# Patient Record
Sex: Male | Born: 1975
Health system: Southern US, Community
[De-identification: ages and names within clinical notes are randomized; demographics above are authoritative.]

## PROBLEM LIST (undated history)

## (undated) DIAGNOSIS — I10 Essential (primary) hypertension: Secondary | ICD-10-CM

## (undated) DIAGNOSIS — E119 Type 2 diabetes mellitus without complications: Secondary | ICD-10-CM

---

## 1998-12-18 ENCOUNTER — Emergency Department (HOSPITAL_COMMUNITY): Admission: EM | Admit: 1998-12-18 | Discharge: 1998-12-18 | Payer: Self-pay | Admitting: Emergency Medicine

## 1999-05-01 ENCOUNTER — Encounter: Payer: Self-pay | Admitting: Emergency Medicine

## 1999-05-01 ENCOUNTER — Emergency Department (HOSPITAL_COMMUNITY): Admission: EM | Admit: 1999-05-01 | Discharge: 1999-05-01 | Payer: Self-pay | Admitting: Emergency Medicine

## 2007-01-17 ENCOUNTER — Encounter: Admission: RE | Admit: 2007-01-17 | Discharge: 2007-01-17 | Payer: Self-pay | Admitting: Emergency Medicine

## 2008-07-08 ENCOUNTER — Emergency Department (HOSPITAL_COMMUNITY): Admission: EM | Admit: 2008-07-08 | Discharge: 2008-07-08 | Payer: Self-pay | Admitting: Emergency Medicine

## 2010-12-21 ENCOUNTER — Other Ambulatory Visit: Payer: Self-pay

## 2010-12-21 ENCOUNTER — Emergency Department (HOSPITAL_COMMUNITY)
Admission: EM | Admit: 2010-12-21 | Discharge: 2010-12-21 | Disposition: A | Discharge status: Home / Self Care | Payer: Self-pay | Attending: Emergency Medicine | Admitting: Emergency Medicine

## 2010-12-21 DIAGNOSIS — R42 Dizziness and giddiness: Secondary | ICD-10-CM | POA: Insufficient documentation

## 2010-12-21 DIAGNOSIS — I10 Essential (primary) hypertension: Secondary | ICD-10-CM | POA: Insufficient documentation

## 2010-12-21 DIAGNOSIS — E669 Obesity, unspecified: Secondary | ICD-10-CM | POA: Insufficient documentation

## 2010-12-21 LAB — URINALYSIS, ROUTINE W REFLEX MICROSCOPIC
Bilirubin Urine: NEGATIVE
Glucose, UA: NEGATIVE mg/dL
Hgb urine dipstick: NEGATIVE
Ketones, ur: NEGATIVE mg/dL
Nitrite: NEGATIVE
Protein, ur: NEGATIVE mg/dL
Specific Gravity, Urine: 1.02 (ref 1.005–1.030)
Urobilinogen, UA: 1 mg/dL (ref 0.0–1.0)
pH: 8 (ref 5.0–8.0)

## 2010-12-21 LAB — URINE MICROSCOPIC-ADD ON

## 2010-12-21 LAB — POCT I-STAT, CHEM 8
BUN: 11 mg/dL (ref 6–23)
Calcium, Ion: 1.2 mmol/L (ref 1.12–1.32)
Chloride: 104 mEq/L (ref 96–112)
Creatinine, Ser: 1 mg/dL (ref 0.50–1.35)
Glucose, Bld: 94 mg/dL (ref 70–99)
HCT: 45 % (ref 39.0–52.0)
Hemoglobin: 15.3 g/dL (ref 13.0–17.0)
Potassium: 5 mEq/L (ref 3.5–5.1)
Sodium: 141 mEq/L (ref 135–145)
TCO2: 27 mmol/L (ref 0–100)

## 2010-12-21 NOTE — ED Provider Notes (Signed)
History     35yM with multiple complaints. Feels  Intermittently  "dizzy." Described sensation of feeling lightheaded and not true vertigo. Has been feeling like this for a little over a week. Feels like in a fog and run down. Concerned that elevated BP may be contrinuting to symptoms. Has been told more than once in past that blood pressure is "borderline high." Has been getting mild diffuse HAs. Denies trauma. NO fever or chills. NO n/v. NO neck pain or stiffness. No rash. Has noticed for past year anterior and lateral L thigh will sometimes go numb when is on feet for long periods of time. Otherwise no numbness, weakness or loss of strength.  CSN: [id number] Arrival date & time: 12/21/2010  2:26 PM   First MD Initiated Contact with Patient 12/21/10 1502      Chief Complaint  Patient presents with  . Dizziness    pt in with headaches weakness  and stated elevated BP for several days denies n/v/d also c/o numbness to the left thigh for several months    (Consider location/radiation/quality/duration/timing/severity/associated sxs/prior treatment) HPI  History reviewed. No pertinent past medical history.  History reviewed. No pertinent past surgical history.  History reviewed. No pertinent family history.  History  Substance Use Topics  . Smoking status: Never Smoker   . Smokeless tobacco: Not on file  . Alcohol Use: No      Review of Systems   Review of symptoms negative unless otherwise noted in HPI.  Allergies  Review of patient's allergies indicates no known allergies.  Home Medications   Current Outpatient Rx  Name Route Sig Dispense Refill  . ACETAMINOPHEN 500 MG PO TABS Oral Take 1,000 mg by mouth every 6 (six) hours as needed. For pain.       BP 182/98  Pulse 83  Temp(Src) 98.4 F (36.9 C) (Oral)  Resp 18  SpO2 99%  Physical Exam  Nursing note and vitals reviewed. Constitutional: He is oriented to person, place, and time. No distress.       Very obese   HENT:  Head: Normocephalic and atraumatic.  Right Ear: External ear normal.  Left Ear: External ear normal.  Nose: Nose normal.  Mouth/Throat: Oropharynx is clear and moist.  Eyes: Conjunctivae and EOM are normal. Pupils are equal, round, and reactive to light. Right eye exhibits no discharge. Left eye exhibits no discharge. No scleral icterus.  Neck: Normal range of motion. Neck supple.  Cardiovascular: Normal rate, regular rhythm and normal heart sounds.  Exam reveals no gallop and no friction rub.   No murmur heard. Pulmonary/Chest: Effort normal and breath sounds normal. No respiratory distress.  Abdominal: Soft. He exhibits no distension. There is no tenderness.  Musculoskeletal: He exhibits no edema and no tenderness.  Lymphadenopathy:    He has no cervical adenopathy.  Neurological: He is alert and oriented to person, place, and time. No cranial nerve deficit. He exhibits normal muscle tone. Coordination normal.  Skin: Skin is warm and dry. He is not diaphoretic.  Psychiatric: He has a normal mood and affect. His behavior is normal. Thought content normal.    ED Course  Procedures (including critical care time)  Labs Reviewed  URINALYSIS, ROUTINE W REFLEX MICROSCOPIC - Abnormal; Notable for the following:    Leukocytes, UA SMALL (*)    All other components within normal limits  URINE MICROSCOPIC-ADD ON  POCT I-STAT, CHEM 8  I-STAT, CHEM 8   No results found.  EKG:  Rhythm: normal sinus  Rate: 71 Axis: normal Intervals: QRS 102 ms ST segments: flipped t in III. No previous for comparison   1. Hypertension   2. Obesity   3. Dizziness - light-headed       MDM  35yM with dizziness and HTN. EKG nondiagnostic. No evidence of end organ damage at this time. Pt clinically very well appearing. Given normal BP once calmed down and settled into bed will refrain from starting on BO med at this time.        Raeford Razor, MD 12/21/10 [phone]

## 2012-01-02 ENCOUNTER — Encounter (HOSPITAL_COMMUNITY): Payer: Self-pay | Admitting: *Deleted

## 2012-01-02 ENCOUNTER — Emergency Department (HOSPITAL_COMMUNITY)
Admission: EM | Admit: 2012-01-02 | Discharge: 2012-01-02 | Disposition: A | Discharge status: Home / Self Care | Payer: No Typology Code available for payment source | Attending: Emergency Medicine | Admitting: Emergency Medicine

## 2012-01-02 DIAGNOSIS — S139XXA Sprain of joints and ligaments of unspecified parts of neck, initial encounter: Secondary | ICD-10-CM | POA: Insufficient documentation

## 2012-01-02 DIAGNOSIS — M549 Dorsalgia, unspecified: Secondary | ICD-10-CM | POA: Insufficient documentation

## 2012-01-02 DIAGNOSIS — Y92410 Unspecified street and highway as the place of occurrence of the external cause: Secondary | ICD-10-CM | POA: Insufficient documentation

## 2012-01-02 DIAGNOSIS — S161XXA Strain of muscle, fascia and tendon at neck level, initial encounter: Secondary | ICD-10-CM

## 2012-01-02 DIAGNOSIS — Y9389 Activity, other specified: Secondary | ICD-10-CM | POA: Insufficient documentation

## 2012-01-02 DIAGNOSIS — I10 Essential (primary) hypertension: Secondary | ICD-10-CM | POA: Insufficient documentation

## 2012-01-02 HISTORY — DX: Essential (primary) hypertension: I10

## 2012-01-02 MED ORDER — CYCLOBENZAPRINE HCL 10 MG PO TABS: 10.0000 mg | ORAL_TABLET | Freq: Two times a day (BID) | ORAL | Status: DC | PRN

## 2012-01-02 MED ORDER — HYDROCODONE-ACETAMINOPHEN 5-325 MG PO TABS: 2.0000 | ORAL_TABLET | Freq: Four times a day (QID) | ORAL | Status: DC | PRN
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
## 2012-01-05 NOTE — ED Provider Notes (Signed)
Medical screening examination/treatment/procedure(s) were performed by non-physician practitioner and as supervising physician I was immediately available for consultation/collaboration.  Tifani Dack T Delrae Hagey, MD 01/05/12 1642 

## 2012-05-21 ENCOUNTER — Encounter (HOSPITAL_COMMUNITY): Payer: Self-pay | Admitting: Physical Medicine and Rehabilitation

## 2012-05-21 ENCOUNTER — Emergency Department (HOSPITAL_COMMUNITY)
Admission: EM | Admit: 2012-05-21 | Discharge: 2012-05-21 | Disposition: A | Discharge status: Home / Self Care | Payer: Self-pay | Attending: Emergency Medicine | Admitting: Emergency Medicine

## 2012-05-21 DIAGNOSIS — Z2089 Contact with and (suspected) exposure to other communicable diseases: Secondary | ICD-10-CM | POA: Insufficient documentation

## 2012-05-21 DIAGNOSIS — Z202 Contact with and (suspected) exposure to infections with a predominantly sexual mode of transmission: Secondary | ICD-10-CM

## 2012-05-21 DIAGNOSIS — I10 Essential (primary) hypertension: Secondary | ICD-10-CM | POA: Insufficient documentation

## 2012-05-21 MED ORDER — METRONIDAZOLE 500 MG PO TABS
2000.0000 mg | ORAL_TABLET | Freq: Once | ORAL | Status: AC
  Administered 2012-05-21: 2000 mg via ORAL
  Filled 2012-05-21: qty 4

## 2012-05-21 NOTE — ED Notes (Signed)
Pt presents to department to be tested for possible STD. States wife tested positive for trichomonas yesterday and pt is concerned he could of been exposed. Denies symptoms at the time. Pt is alert and oriented x4. No signs of distress noted.

## 2012-05-21 NOTE — ED Notes (Signed)
Pt requesting STD check. Denies symptoms at the time.

## 2012-05-21 NOTE — ED Provider Notes (Signed)
History     CSN: [id number]  Arrival date & time 05/21/12  4098   First MD Initiated Contact with Patient 05/21/12 [phone]      Chief Complaint  Patient presents with  . Exposure to STD    (Consider location/radiation/quality/duration/timing/severity/associated sxs/prior treatment) HPI Patient presents to the emergency department with concerns related to his wife having trichomonas in her urine.  Patient was advised to come here for treatment.  Patient denies penile discharge, dysuria, fever, testicular pain or swelling.  Patient, states, that he has never had an STD. Past Medical History  Diagnosis Date  . Hypertension     No past surgical history on file.  History reviewed. No pertinent family history.  History  Substance Use Topics  . Smoking status: Never Smoker   . Smokeless tobacco: Never Used  . Alcohol Use: No      Review of Systems All other systems negative except as documented in the HPI. All pertinent positives and negatives as reviewed in the HPI. Allergies  Review of patient's allergies indicates no known allergies.  Home Medications  No current outpatient prescriptions on file.  BP 154/86  Pulse 86  Temp(Src) 98.1 F (36.7 C) (Oral)  Resp 18  SpO2 95%  Physical Exam  Nursing note and vitals reviewed. Constitutional: He appears well-developed and well-nourished. No distress.  HENT:  Head: Normocephalic and atraumatic.  Eyes: Pupils are equal, round, and reactive to light.  Genitourinary: Testes normal and penis normal. No penile tenderness.  Skin: Skin is warm and dry. No rash noted.    ED Course  Procedures (including critical care time)  The patient refused uretheral swab. Told to return here as needed.    MDM          Carlyle Dolly, PA-C 05/21/12 [phone]

## 2012-05-21 NOTE — ED Provider Notes (Signed)
Medical screening examination/treatment/procedure(s) were performed by non-physician practitioner and as supervising physician I was immediately available for consultation/collaboration.  Remy Voiles R. Elgin Carn, MD 05/21/12 1541 

## 2012-07-05 ENCOUNTER — Encounter (HOSPITAL_COMMUNITY): Payer: Self-pay | Admitting: Emergency Medicine

## 2012-07-05 ENCOUNTER — Emergency Department (HOSPITAL_COMMUNITY)
Admission: EM | Admit: 2012-07-05 | Discharge: 2012-07-05 | Disposition: A | Discharge status: Home / Self Care | Payer: Self-pay | Attending: Emergency Medicine | Admitting: Emergency Medicine

## 2012-07-05 DIAGNOSIS — Y929 Unspecified place or not applicable: Secondary | ICD-10-CM | POA: Insufficient documentation

## 2012-07-05 DIAGNOSIS — I10 Essential (primary) hypertension: Secondary | ICD-10-CM | POA: Insufficient documentation

## 2012-07-05 DIAGNOSIS — W268XXA Contact with other sharp object(s), not elsewhere classified, initial encounter: Secondary | ICD-10-CM | POA: Insufficient documentation

## 2012-07-05 DIAGNOSIS — S61409A Unspecified open wound of unspecified hand, initial encounter: Secondary | ICD-10-CM | POA: Insufficient documentation

## 2012-07-05 DIAGNOSIS — Y9389 Activity, other specified: Secondary | ICD-10-CM | POA: Insufficient documentation

## 2012-07-05 DIAGNOSIS — Z23 Encounter for immunization: Secondary | ICD-10-CM | POA: Insufficient documentation

## 2012-07-05 DIAGNOSIS — S61412A Laceration without foreign body of left hand, initial encounter: Secondary | ICD-10-CM

## 2012-07-05 MED ORDER — TETANUS-DIPHTH-ACELL PERTUSSIS 5-2.5-18.5 LF-MCG/0.5 IM SUSP
0.5000 mL | Freq: Once | INTRAMUSCULAR | Status: AC
  Administered 2012-07-05: 0.5 mL via INTRAMUSCULAR
  Filled 2012-07-05: qty 0.5

## 2012-07-05 NOTE — ED Notes (Signed)
Pt here with laceration to left hand; bleeding controlled

## 2012-07-05 NOTE — ED Provider Notes (Signed)
History    This chart was scribed for Junius Finner (PA) non-physician practitioner working with Gwyneth Sprout, MD by Sofie Rower, ED Scribe. This patient was seen in room TR07C/TR07C and the patient's care was started at 7:25PM.   CSN: [id number]  Arrival date & time 07/05/12  1706   First MD Initiated Contact with Patient 07/05/12 1925      Chief Complaint  Patient presents with  . Extremity Laceration    (Consider location/radiation/quality/duration/timing/severity/associated sxs/prior treatment) The history is provided by the patient. No language interpreter was used.    Anthony Collier is a 37 y.o. male , with a hx of hypertension, who presents to the Emergency Department complaining of sudden, moderate, extremity laceration, located at the dorsal aspect of the left hand, onset today (07/05/12 at 2:00PM). The pt reports he was taking out the trash earlier this evening, where he suddenly cut his left hand upon a piece of metal. The pt has applied a bandage dressing upon the laceration, which controls all the bleeding associated with the laceration at this time. The pt is unaware of the date of his last tetanus immunization.   The pt does not smoke or drink alcohol.   Pt does not have a PCP.    Past Medical History  Diagnosis Date  . Hypertension     History reviewed. No pertinent past surgical history.  History reviewed. No pertinent family history.  History  Substance Use Topics  . Smoking status: Never Smoker   . Smokeless tobacco: Never Used  . Alcohol Use: No      Review of Systems  Skin: Positive for wound.  All other systems reviewed and are negative.    Allergies  Review of patient's allergies indicates no known allergies.  Home Medications  No current outpatient prescriptions on file.  BP 182/83  Pulse 88  Temp(Src) 98.8 F (37.1 C) (Oral)  Resp 18  SpO2 98%  Physical Exam  Nursing note and vitals reviewed. Constitutional: He is oriented  to person, place, and time. He appears well-developed and well-nourished. No distress.  HENT:  Head: Normocephalic and atraumatic.  Eyes: EOM are normal.  Neck: Neck supple. No tracheal deviation present.  Cardiovascular: Normal rate, regular rhythm and normal heart sounds.  Exam reveals no gallop and no friction rub.   No murmur heard. Pulmonary/Chest: Effort normal and breath sounds normal. No respiratory distress. He has no wheezes.  Musculoskeletal: Normal range of motion.  Neurological: He is alert and oriented to person, place, and time.  Skin: Skin is warm and dry. Laceration noted.     4 cm superficial laceration located at the dorsal aspect of the left hand. Hemostasis achieved. No erythema, no warmth, nor drainage. No foreign bodies.   Psychiatric: He has a normal mood and affect. His behavior is normal.    ED Course  Procedures (including critical care time)  DIAGNOSTIC STUDIES: Oxygen Saturation is 98% on room air, normal by my interpretation.    COORDINATION OF CARE:  7:29 PM- Treatment plan discussed with patient. Pt agrees with treatment.      Labs Reviewed - No data to display No results found.   1. Superficial laceration of hand, left, initial encounter       MDM  Pt has superficial laceration on left hand.  Wound was cleaned under tap water then dressed with guaze and antibacterial ointment.  Pt was given tetanus shot in ED.  Discussed with pt to seek medical attention if notices any  signs of infection.  F/u with PCP, [place] [facility] info provided. Return precautions given. Pt verbalized understanding and agreement with tx plan. Vitals: unremarkable. Discharged in stable condition.     I personally performed the services described in this documentation, which was scribed in my presence. The recorded information has been reviewed and is accurate.    Junius Finner, PA-C 07/06/12 1219

## 2012-07-06 NOTE — ED Provider Notes (Signed)
Medical screening examination/treatment/procedure(s) were performed by non-physician practitioner and as supervising physician I was immediately available for consultation/collaboration.   Gwyneth Sprout, MD 07/06/12 2318

## 2013-06-13 ENCOUNTER — Emergency Department (HOSPITAL_COMMUNITY): Payer: BC Managed Care – PPO

## 2013-06-13 ENCOUNTER — Emergency Department (HOSPITAL_COMMUNITY)
Admission: EM | Admit: 2013-06-13 | Discharge: 2013-06-13 | Disposition: A | Discharge status: Home / Self Care | Payer: BC Managed Care – PPO | Attending: Emergency Medicine | Admitting: Emergency Medicine

## 2013-06-13 ENCOUNTER — Encounter (HOSPITAL_COMMUNITY): Payer: Self-pay | Admitting: Emergency Medicine

## 2013-06-13 DIAGNOSIS — M25511 Pain in right shoulder: Secondary | ICD-10-CM

## 2013-06-13 DIAGNOSIS — M722 Plantar fascial fibromatosis: Secondary | ICD-10-CM

## 2013-06-13 DIAGNOSIS — Y9389 Activity, other specified: Secondary | ICD-10-CM | POA: Insufficient documentation

## 2013-06-13 DIAGNOSIS — S46909A Unspecified injury of unspecified muscle, fascia and tendon at shoulder and upper arm level, unspecified arm, initial encounter: Secondary | ICD-10-CM | POA: Insufficient documentation

## 2013-06-13 DIAGNOSIS — S199XXA Unspecified injury of neck, initial encounter: Secondary | ICD-10-CM

## 2013-06-13 DIAGNOSIS — X503XXA Overexertion from repetitive movements, initial encounter: Secondary | ICD-10-CM | POA: Insufficient documentation

## 2013-06-13 DIAGNOSIS — S0993XA Unspecified injury of face, initial encounter: Secondary | ICD-10-CM | POA: Insufficient documentation

## 2013-06-13 DIAGNOSIS — I10 Essential (primary) hypertension: Secondary | ICD-10-CM | POA: Insufficient documentation

## 2013-06-13 DIAGNOSIS — Y9289 Other specified places as the place of occurrence of the external cause: Secondary | ICD-10-CM | POA: Insufficient documentation

## 2013-06-13 DIAGNOSIS — Y990 Civilian activity done for income or pay: Secondary | ICD-10-CM | POA: Insufficient documentation

## 2013-06-13 DIAGNOSIS — S4980XA Other specified injuries of shoulder and upper arm, unspecified arm, initial encounter: Secondary | ICD-10-CM | POA: Insufficient documentation

## 2013-06-13 MED ORDER — IBUPROFEN 800 MG PO TABS: 800.0000 mg | ORAL_TABLET | Freq: Three times a day (TID) | ORAL | Status: DC

## 2013-06-13 NOTE — Discharge Instructions (Signed)
Use a frozen water bottle to stretch or your foot as discussed. Followup with orthopedics. Take ibuprofen as directed. Apply ice to your shoulder and minimize your lifting with that arm.  Shoulder Pain The shoulder is the joint that connects your arms to your body. The bones that form the shoulder joint include the upper arm bone (humerus), the shoulder blade (scapula), and the collarbone (clavicle). The top of the humerus is shaped like a ball and fits into a rather flat socket on the scapula (glenoid cavity). A combination of muscles and strong, fibrous tissues that connect muscles to bones (tendons) support your shoulder joint and hold the ball in the socket. Small, fluid-filled sacs (bursae) are located in different areas of the joint. They act as cushions between the bones and the overlying soft tissues and help reduce friction between the gliding tendons and the bone as you move your arm. Your shoulder joint allows a wide range of motion in your arm. This range of motion allows you to do things like scratch your back or throw a ball. However, this range of motion also makes your shoulder more prone to pain from overuse and injury. Causes of shoulder pain can originate from both injury and overuse and usually can be grouped in the following four categories:  Redness, swelling, and pain (inflammation) of the tendon (tendinitis) or the bursae (bursitis).  Instability, such as a dislocation of the joint.  Inflammation of the joint (arthritis).  Broken bone (fracture). HOME CARE INSTRUCTIONS   Apply ice to the sore area.  Put ice in a plastic bag.  Place a towel between your skin and the bag.  Leave the ice on for 15-20 minutes, 03-04 times per day for the first 2 days.  Stop using cold packs if they do not help with the pain.  If you have a shoulder sling or immobilizer, wear it as long as your caregiver instructs. Only remove it to shower or bathe. Move your arm as little as possible, but  keep your hand moving to prevent swelling.  Squeeze a soft ball or foam pad as much as possible to help prevent swelling.  Only take over-the-counter or prescription medicines for pain, discomfort, or fever as directed by your caregiver. SEEK MEDICAL CARE IF:   Your shoulder pain increases, or new pain develops in your arm, hand, or fingers.  Your hand or fingers become cold and numb.  Your pain is not relieved with medicines. SEEK IMMEDIATE MEDICAL CARE IF:   Your arm, hand, or fingers are numb or tingling.  Your arm, hand, or fingers are significantly swollen or turn white or blue. MAKE SURE YOU:   Understand these instructions.  Will watch your condition.  Will get help right away if you are not doing well or get worse. Document Released: 10/22/2004 Document Revised: 10/07/2011 Document Reviewed: 12/27/2010 [facility] Patient Information 2014 [place], [place].  Plantar Fasciitis Plantar fasciitis is a common condition that causes foot pain. It is soreness (inflammation) of the band of tough fibrous tissue on the bottom of the foot that runs from the heel bone (calcaneus) to the ball of the foot. The cause of this soreness may be from excessive standing, poor fitting shoes, running on hard surfaces, being overweight, having an abnormal walk, or overuse (this is common in runners) of the painful foot or feet. It is also common in aerobic exercise dancers and ballet dancers. SYMPTOMS  Most people with plantar fasciitis complain of:  Severe pain in the morning  on the bottom of their foot especially when taking the first steps out of bed. This pain recedes after a few minutes of walking.  Severe pain is experienced also during walking following a long period of inactivity.  Pain is worse when walking barefoot or up stairs DIAGNOSIS   Your caregiver will diagnose this condition by examining and feeling your foot.  Special tests such as X-rays of your foot, are usually not  needed. PREVENTION   Consult a sports medicine professional before beginning a new exercise program.  Walking programs offer a good workout. With walking there is a lower chance of overuse injuries common to runners. There is less impact and less jarring of the joints.  Begin all new exercise programs slowly. If problems or pain develop, decrease the amount of time or distance until you are at a comfortable level.  Wear good shoes and replace them regularly.  Stretch your foot and the heel cords at the back of the ankle (Achilles tendon) both before and after exercise.  Run or exercise on even surfaces that are not hard. For example, asphalt is better than pavement.  Do not run barefoot on hard surfaces.  If using a treadmill, vary the incline.  Do not continue to workout if you have foot or joint problems. Seek professional help if they do not improve. HOME CARE INSTRUCTIONS   Avoid activities that cause you pain until you recover.  Use ice or cold packs on the problem or painful areas after working out.  Only take over-the-counter or prescription medicines for pain, discomfort, or fever as directed by your caregiver.  Soft shoe inserts or athletic shoes with air or gel sole cushions may be helpful.  If problems continue or become more severe, consult a sports medicine caregiver or your own health care provider. Cortisone is a potent anti-inflammatory medication that may be injected into the painful area. You can discuss this treatment with your caregiver. MAKE SURE YOU:   Understand these instructions.  Will watch your condition.  Will get help right away if you are not doing well or get worse. Document Released: 10/07/2000 Document Revised: 04/06/2011 Document Reviewed: 12/07/2007 [facility]ExitCare Patient Information 2014 [place]ExitCare, [place]LL[place].

## 2013-06-13 NOTE — ED Notes (Signed)
Pt to radiology.

## 2013-06-13 NOTE — ED Provider Notes (Signed)
CSN: [id number]     Arrival date & time 06/13/13  1141 History  This chart was scribed for Johnnette Gourdobyn Albert PA-C, working with Geoffery Lyonsouglas Delo, MD by Ashley JacobsBrittany Andrews, ED scribe. This patient was seen in room WTR6/WTR6 and the patient's care was started at 12:22 PM.   First MD Initiated Contact with Patient 06/13/13 1143     Chief Complaint  Patient presents with  . Shoulder Pain     (Consider location/radiation/quality/duration/timing/severity/associated sxs/prior Treatment) Patient is a 38 y.o. male presenting with shoulder pain. The history is provided by the patient and medical records. No language interpreter was used.  Shoulder Pain   HPI Comments: Anthony Collier is a 38 y.o. male who presents to the Emergency Department complaining of right shoulder pain, onset one week ago while pushing a heavy object upwards while at work. The pain intermittently radiates to his neck. The pain is described as a 5/10 in severity. Anthony Collier reports the pain is worse when Anthony Collier wakes in the morning and with movement. Denies numbness and tingling.  Denies prior injury.  Patient also complaining of left foot pain x6 months. Patient states when Anthony Collier wakes up in the morning Anthony Collier has a sharp pain when Anthony Collier takes a step down, goes away after Anthony Collier walks for a while, however if Anthony Collier is immobile and standing the pain returns. Anthony Collier has not tried any alleviating factors.  Past Medical History  Diagnosis Date  . Hypertension    History reviewed. No pertinent past surgical history. No family history on file. History  Substance Use Topics  . Smoking status: Never Smoker   . Smokeless tobacco: Never Used  . Alcohol Use: No    Review of Systems  Musculoskeletal: Positive for neck pain.       Right shoulder pain.    Neurological: Negative for numbness.      Allergies  Review of patient's allergies indicates no known allergies.  Home Medications   Prior to Admission medications   Not on File   BP 127/74  Pulse 77   Temp(Src) 97.9 F (36.6 C) (Oral)  Resp 20  Ht 6\' 2"  (1.88 m)  Wt 345 lb (156.491 kg)  BMI 44.28 kg/m2  SpO2 97% Physical Exam  Nursing note and vitals reviewed. Constitutional: Anthony Collier is oriented to person, place, and time. Anthony Collier appears well-developed and well-nourished. No distress.  HENT:  Head: Normocephalic and atraumatic.  Eyes: Conjunctivae and EOM are normal.  Neck: Normal range of motion. Neck supple.  Cardiovascular: Normal rate, regular rhythm and normal heart sounds.   Intact distal pulses.  Pulmonary/Chest: Effort normal and breath sounds normal.  Musculoskeletal: Normal range of motion. Anthony Collier exhibits tenderness. Anthony Collier exhibits no edema.  Anterior aspect of R shoulder Full ROM pain noted with flexion, extension and abduction C-spine neck is normal Tenderness at insertion of the bicep tendon. Tenderness to palpation of her plantar fascia of left foot. Full range of motion. Normal gait.  Neurological: Anthony Collier is alert and oriented to person, place, and time.  Skin: Skin is warm and dry.  Psychiatric: Anthony Collier has a normal mood and affect. His behavior is normal.    ED Course  Procedures (including critical care time) DIAGNOSTIC STUDIES: Oxygen Saturation is 97% on room air, normal by my interpretation.    COORDINATION OF CARE:  12:25 PM Discussed course of care with pt . Pt understands and agrees.   Labs Review Labs Reviewed - No data to display  Imaging Review Dg Shoulder Right  06/13/2013  CLINICAL DATA:  Right shoulder pain for 1 week  EXAM: RIGHT SHOULDER - 2+ VIEW  COMPARISON:  None.  FINDINGS: There is no evidence of fracture or dislocation. There is no evidence of arthropathy or other focal bone abnormality. Soft tissues are unremarkable.  IMPRESSION: Negative.   Electronically Signed   By: Elige KoHetal  Patel   On: 06/13/2013 13:15     EKG Interpretation None      MDM   Final diagnoses:  Right shoulder pain  Plantar fasciitis of left foot   Patient presenting with  shoulder pain after mechanical injury and foot pain. Well-appearing in no apparent distress. Afebrile, vital signs stable. Shoulder x-ray without any acute findings. Advised rest, ice and NSAIDs. Sling offered, patient refuses. Discussed stretches for her plantar fasciitis. Followup with orthopedics if no improvement. Stable for discharge. Return precautions given. Patient states understanding of treatment care plan and is agreeable.    I personally performed the services described in this documentation, which was scribed in my presence. The recorded information has been reviewed and is accurate.   Trevor MaceRobyn M Albert, PA-C 06/13/13 [phone]

## 2013-06-13 NOTE — ED Notes (Signed)
Pt A+Ox4, reports "i think i have a sprain", sts injured R shoulder while at work x1 week ago and c/o R shoulder pain since.  5/10 currently.  Pain worse "when I wake up in the morning" and worse with movement.  Pt denies n/t to extremity.  Moving arm independently without issue.  +csm/+pulses.

## 2013-06-13 NOTE — Progress Notes (Signed)
  CARE MANAGEMENT ED NOTE 06/13/2013  Patient:  Anthony Collier,Anthony Collier   Account Number:  [id number]  Date Initiated:  06/13/2013  Documentation initiated by:  Edd ArbourGIBBS,Brendalee Matthies  Subjective/Objective Assessment:   38 yr old [facility] [place] with c/o shoulder pain Reports being seen at [facility] last and will return to be seen if needed to [facility]     Subjective/Objective Assessment Detail:   Pt voiced understanding of resources offered     Action/Plan:   epic updated CM reviewed with pt how to get [facility] list of in network pcps via [facility] website or by calling toll free number and speaking with customer services   Action/Plan Detail:   Anticipated DC Date:  06/13/2013     Status Recommendation to Physician:   Result of Recommendation:    Other ED Services  Consult Working Plan    DC Planning Services  Other  PCP issues  Outpatient Services - Pt will follow up    Choice offered to / List presented to:            Status of service:  Completed, signed off  ED Comments:   ED Comments Detail:

## 2013-06-14 NOTE — ED Provider Notes (Signed)
Medical screening examination/treatment/procedure(s) were performed by non-physician practitioner and as supervising physician I was immediately available for consultation/collaboration.     Yvone Slape, MD 06/14/13 0709 

## 2014-08-24 ENCOUNTER — Encounter (HOSPITAL_COMMUNITY): Payer: Self-pay | Admitting: Emergency Medicine

## 2014-08-24 ENCOUNTER — Emergency Department (INDEPENDENT_AMBULATORY_CARE_PROVIDER_SITE_OTHER)
Admission: EM | Admit: 2014-08-24 | Discharge: 2014-08-24 | Disposition: A | Discharge status: Home / Self Care | Payer: BLUE CROSS/BLUE SHIELD | Source: Home / Self Care | Attending: Family Medicine | Admitting: Family Medicine

## 2014-08-24 DIAGNOSIS — L723 Sebaceous cyst: Secondary | ICD-10-CM | POA: Diagnosis not present

## 2014-08-24 NOTE — ED Provider Notes (Signed)
CSN: [id number]     Arrival date & time 08/24/14  1422 History   First MD Initiated Contact with Patient 08/24/14 1644     Chief Complaint  Patient presents with  . Abscess   (Consider location/radiation/quality/duration/timing/severity/associated sxs/prior Treatment) HPI  L armpit boil. Started years ago.  Wife tried popping it last week w/ some discharge.  Started to drain foul-smelling discharge the other day.  Problem is constant and getting worse.  Denies fevers, rash, chest pain, shortness breath, palpitations, unintentional weight loss, fevers, night sweats, headache. Patient is not taken any medicines for this.       Past Medical History  Diagnosis Date  . Hypertension    History reviewed. No pertinent past surgical history. Family History  Problem Relation Age of Onset  . Kidney failure Father   . Aneurysm Father   . Hypertension Other    History  Substance Use Topics  . Smoking status: Never Smoker   . Smokeless tobacco: Never Used  . Alcohol Use: No    Review of Systems Per HPI with all other pertinent systems negative.   Allergies  Review of patient's allergies indicates no known allergies.  Home Medications   Prior to Admission medications   Medication Sig Start Date End Date Taking? Authorizing Provider  ibuprofen (ADVIL,MOTRIN) 800 MG tablet Take 1 tablet (800 mg total) by mouth 3 (three) times daily. 06/13/13   Robyn M Hess, PA-C   BP 120/86 mmHg  Pulse 78  Temp(Src) 98.1 F (36.7 C) (Oral)  Resp 16  SpO2 96% Physical Exam Physical Exam  Constitutional: oriented to person, place, and time. appears well-developed and well-nourished. No distress.  HENT:  Head: Normocephalic and atraumatic.  Eyes: EOMI. PERRL.  Neck: Normal range of motion.  Cardiovascular: RRR, no m/r/g, 2+ distal pulses,  Pulmonary/Chest: Effort normal and breath sounds normal. No respiratory distress.  Abdominal: Soft. Bowel sounds are normal. NonTTP, no distension.   Musculoskeletal: Normal range of motion. Non ttp, no effusion.  Neurological: alert and oriented to person, place, and time.  Skin: 1.5x1.5cm circumferential well-demarcated lesion just under the surface of the skin with a sinus tract that drains a small amount of purulent discharge when squeezed.  Psychiatric: normal mood and affect. behavior is normal. Judgment and thought content normal.   ED Course  INCISION AND DRAINAGE Date/Time: 08/24/2014 5:32 PM Performed by: Konrad Dolores, Sakiya Stepka J Authorized by: Konrad Dolores, Aki Burdin J Consent: Verbal consent obtained. Risks and benefits: risks, benefits and alternatives were discussed Consent given by: patient Patient identity confirmed: verbally with patient Type: cyst Location: Left axilla. Anesthesia: local infiltration Local anesthetic: lidocaine 1% with epinephrine Anesthetic total: 3 ml Patient sedated: no Scalpel size: 11 Incision type: single straight Complexity: simple Wound treatment: wound left open Patient tolerance: Patient tolerated the procedure well with no immediate complications Comments: After cleaning the surgical area in the usual sterile fashion a 1 cm single straight incision was made just lateral to the area of the sebaceous cyst and central tract. The skin was retracted and the cyst was visualized. Using blunt dissection with sterile forceps and pickups the sac was removed from the underlying subcutaneous tissue. There is partial rupture of the sac and sebaceous material was removed. The vast majority of the sac was removed. The wound was left opened and anabolic ointment and sterile bandage applied to the area.   (including critical care time) Labs Review Labs Reviewed - No data to display  Imaging Review No results found.   MDM  1. Sebaceous cyst    Excision as above due to area becoming an inflamed and possibly infected. Glottic ointment, wound care instructions provided.   Ozella Rocks, MD 08/24/14 [phone]

## 2014-08-24 NOTE — Discharge Instructions (Signed)
You had cyst removed from under your arm. Please resume normal daily activities. Please wash and clean the area every morning the shower and then apply antibiotic ointment and a bandage as needed until the wound closes over. Hopefully the cyst never returns.

## 2014-08-24 NOTE — ED Notes (Signed)
Pt c/o painless abscess on left axilla onset 1 year +++ Denies fevers, chills, drainage... But does report a foul odor to it Alert, no signs of acute distress.

## 2017-03-25 ENCOUNTER — Encounter: Payer: Self-pay | Admitting: Family Medicine

## 2017-03-25 ENCOUNTER — Ambulatory Visit (INDEPENDENT_AMBULATORY_CARE_PROVIDER_SITE_OTHER): Payer: BLUE CROSS/BLUE SHIELD | Admitting: Family Medicine

## 2017-03-25 ENCOUNTER — Other Ambulatory Visit: Payer: Self-pay

## 2017-03-25 VITALS — BP 158/68 | HR 88 | Temp 98.5°F | Ht 72.0 in | Wt >= 6400 oz

## 2017-03-25 DIAGNOSIS — G8929 Other chronic pain: Secondary | ICD-10-CM

## 2017-03-25 DIAGNOSIS — Z131 Encounter for screening for diabetes mellitus: Secondary | ICD-10-CM

## 2017-03-25 DIAGNOSIS — Z0000 Encounter for general adult medical examination without abnormal findings: Secondary | ICD-10-CM | POA: Diagnosis not present

## 2017-03-25 DIAGNOSIS — Z13220 Encounter for screening for lipoid disorders: Secondary | ICD-10-CM

## 2017-03-25 DIAGNOSIS — M25562 Pain in left knee: Secondary | ICD-10-CM | POA: Diagnosis not present

## 2017-03-25 DIAGNOSIS — Z114 Encounter for screening for human immunodeficiency virus [HIV]: Secondary | ICD-10-CM

## 2017-03-25 MED ORDER — MELOXICAM 15 MG PO TABS: 15.0000 mg | ORAL_TABLET | Freq: Every day | ORAL | 0 refills | Status: DC

## 2017-03-25 NOTE — Patient Instructions (Addendum)
It was great seeing you today! We have addressed the following issues today  1. I order blood work to check on kidney, liver function as well check for diabetes. 2. I order an xray of your left knee to further evaluate the pain. We will follow up on the results. 3. Your blood pressure was elevated today. Will recheck at next visit and will start you on medications if still high.  If we did any lab work today, and the results require attention, either me or my nurse will get in touch with you. If everything is normal, you will get a letter in mail and a message via . If you don't hear from [place] in two weeks, please give [place] a call. Otherwise, we look forward to seeing you again at your next visit. If you have any questions or concerns before then, please call the clinic at [phone].  Please bring all your medications to every doctors visit  Sign up for My Chart to have easy access to your labs results, and communication with your Primary care physician. Please ask Front Desk for some assistance.   Please check-out at the front desk before leaving the clinic.    Take Care,   Dr. Sydnee Cabal   Knee Exercises Ask your health care provider which exercises are safe for you. Do exercises exactly as told by your health care provider and adjust them as directed. It is normal to feel mild stretching, pulling, tightness, or discomfort as you do these exercises, but you should stop right away if you feel sudden pain or your pain gets worse.Do not begin these exercises until told by your health care provider. STRETCHING AND RANGE OF MOTION EXERCISES These exercises warm up your muscles and joints and improve the movement and flexibility of your knee. These exercises also help to relieve pain, numbness, and tingling. Exercise A: Knee Extension, Prone 1. Lie on your abdomen on a bed. 2. Place your left / right knee just beyond the edge of the surface so your knee is not on the bed. You can put a towel  under your left / right thigh just above your knee for comfort. 3. Relax your leg muscles and allow gravity to straighten your knee. You should feel a stretch behind your left / right knee. 4. Hold this position for __________ seconds. 5. Scoot up so your knee is supported between repetitions. Repeat __________ times. Complete this stretch __________ times a day. Exercise B: Knee Flexion, Active  1. Lie on your back with both knees straight. If this causes back discomfort, bend your left / right knee so your foot is flat on the floor. 2. Slowly slide your left / right heel back toward your buttocks until you feel a gentle stretch in the front of your knee or thigh. 3. Hold this position for __________ seconds. 4. Slowly slide your left / right heel back to the starting position. Repeat __________ times. Complete this exercise __________ times a day. Exercise C: Quadriceps, Prone  1. Lie on your abdomen on a firm surface, such as a bed or padded floor. 2. Bend your left / right knee and hold your ankle. If you cannot reach your ankle or pant leg, loop a belt around your foot and grab the belt instead. 3. Gently pull your heel toward your buttocks. Your knee should not slide out to the side. You should feel a stretch in the front of your thigh and knee. 4. Hold this position for __________  seconds. Repeat __________ times. Complete this stretch __________ times a day. Exercise D: Hamstring, Supine 1. Lie on your back. 2. Loop a belt or towel over the ball of your left / right foot. The ball of your foot is on the walking surface, right under your toes. 3. Straighten your left / right knee and slowly pull on the belt to raise your leg until you feel a gentle stretch behind your knee. ? Do not let your left / right knee bend while you do this. ? Keep your other leg flat on the floor. 4. Hold this position for __________ seconds. Repeat __________ times. Complete this stretch __________ times a  day. STRENGTHENING EXERCISES These exercises build strength and endurance in your knee. Endurance is the ability to use your muscles for a long time, even after they get tired. Exercise E: Quadriceps, Isometric  1. Lie on your back with your left / right leg extended and your other knee bent. Put a rolled towel or small pillow under your knee if told by your health care provider. 2. Slowly tense the muscles in the front of your left / right thigh. You should see your kneecap slide up toward your hip or see increased dimpling just above the knee. This motion will push the back of the knee toward the floor. 3. For __________ seconds, keep the muscle as tight as you can without increasing your pain. 4. Relax the muscles slowly and completely. Repeat __________ times. Complete this exercise __________ times a day. Exercise F: Straight Leg Raises - Quadriceps 1. Lie on your back with your left / right leg extended and your other knee bent. 2. Tense the muscles in the front of your left / right thigh. You should see your kneecap slide up or see increased dimpling just above the knee. Your thigh may even shake a bit. 3. Keep these muscles tight as you raise your leg 4-6 inches (10-15 cm) off the floor. Do not let your knee bend. 4. Hold this position for __________ seconds. 5. Keep these muscles tense as you lower your leg. 6. Relax your muscles slowly and completely after each repetition. Repeat __________ times. Complete this exercise __________ times a day. Exercise G: Hamstring, Isometric 1. Lie on your back on a firm surface. 2. Bend your left / right knee approximately __________ degrees. 3. Dig your left / right heel into the surface as if you are trying to pull it toward your buttocks. Tighten the muscles in the back of your thighs to dig as hard as you can without increasing any pain. 4. Hold this position for __________ seconds. 5. Release the tension gradually and allow your muscles to  relax completely for __________ seconds after each repetition. Repeat __________ times. Complete this exercise __________ times a day. Exercise H: Hamstring Curls  If told by your health care provider, do this exercise while wearing ankle weights. Begin with __________ weights. Then increase the weight by 1 lb (0.5 kg) increments. Do not wear ankle weights that are more than __________. 1. Lie on your abdomen with your legs straight. 2. Bend your left / right knee as far as you can without feeling pain. Keep your hips flat against the floor. 3. Hold this position for __________ seconds. 4. Slowly lower your leg to the starting position.  Repeat __________ times. Complete this exercise __________ times a day. Exercise I: Squats (Quadriceps) 1. Stand in front of a table, with your feet and knees pointing straight ahead. You  may rest your hands on the table for balance but not for support. 2. Slowly bend your knees and lower your hips like you are going to sit in a chair. ? Keep your weight over your heels, not over your toes. ? Keep your lower legs upright so they are parallel with the table legs. ? Do not let your hips go lower than your knees. ? Do not bend lower than told by your health care provider. ? If your knee pain increases, do not bend as low. 3. Hold the squat position for __________ seconds. 4. Slowly push with your legs to return to standing. Do not use your hands to pull yourself to standing. Repeat __________ times. Complete this exercise __________ times a day. Exercise J: Wall Slides (Quadriceps)  1. Lean your back against a smooth wall or door while you walk your feet out 18-24 inches (46-61 cm) from it. 2. Place your feet hip-width apart. 3. Slowly slide down the wall or door until your knees bend __________ degrees. Keep your knees over your heels, not over your toes. Keep your knees in line with your hips. 4. Hold for __________ seconds. Repeat __________ times.  Complete this exercise __________ times a day. Exercise K: Straight Leg Raises - Hip Abductors 1. Lie on your side with your left / right leg in the top position. Lie so your head, shoulder, knee, and hip line up. You may bend your bottom knee to help you keep your balance. 2. Roll your hips slightly forward so your hips are stacked directly over each other and your left / right knee is facing forward. 3. Leading with your heel, lift your top leg 4-6 inches (10-15 cm). You should feel the muscles in your outer hip lifting. ? Do not let your foot drift forward. ? Do not let your knee roll toward the ceiling. 4. Hold this position for __________ seconds. 5. Slowly return your leg to the starting position. 6. Let your muscles relax completely after each repetition. Repeat __________ times. Complete this exercise __________ times a day. Exercise L: Straight Leg Raises - Hip Extensors 1. Lie on your abdomen on a firm surface. You can put a pillow under your hips if that is more comfortable. 2. Tense the muscles in your buttocks and lift your left / right leg about 4-6 inches (10-15 cm). Keep your knee straight as you lift your leg. 3. Hold this position for __________ seconds. 4. Slowly lower your leg to the starting position. 5. Let your leg relax completely after each repetition. Repeat __________ times. Complete this exercise __________ times a day. This information is not intended to replace advice given to you by your health care provider. Make sure you discuss any questions you have with your health care provider. Document Released: 11/26/2004 Document Revised: 10/07/2015 Document Reviewed: 11/18/2014 [employer]vier Interactive Patient Education  2018 [employer].

## 2017-03-25 NOTE — Progress Notes (Signed)
Subjective:   Chief Complaint  Patient presents with  . Establish Care  . Leg Pain    left leg pain, chronic  . Chest Pain    with eating, drinking or lying down sometimes   HPI Anthony Collier is a 42 y.o. old male here  for annual exam.  Concern today: Changes in his/her health in the last 12 months: no Occupation:  Dealer at [employer] Wears seatbelt: yes.    The patient has regular exercise: no.   Enough vegetables and fruits: yes.  Smokes cigarette: no Drinks EtOH: socially   Drug use: no Patient takes ASA: no.  Patient takes vitD & Ca: no. Ever been transfused or tattooed?: no.  The patient is sexually active.  Patient uses birth control: no.  Domestic violence: no.  Advance directive: not applicable. MOST: no.   History of depression:no.  Patient dental home: yes.   Immunizations  Needs influenza vaccine: no.  Needs HPV (Women until age 61): not applicable.  Needs Shingrix (all >35yr of age): not applicable.  Needs Tdap: not applicable.  Needs Pneumococcal: not applicable. 1. 145to 40years of age  -Intermediate risk groups (smokers; chronic heart, lung and liver  disease, DM & alcoholism) PPSV23 alone:  (Grade 1B).   -High risk groups (asplenia, immunocompromised [HIV, CA], CSF leak, cochlear implant, advanced kidney dis)-PCV13, then PPSV23 after 8 wks. (Grade 1B). PCV13 after 13yrf already had PPSV23.  2.   Age ? 65: PCV13 followed by PPSV23 6 to 12 months later. PCV13 after 1y50yr already had PPSV23.  Screening Need colon cancer screening: no. Need breast cancer ccreening: no. Need cervical cancer Screening: not applicable. STOP BANG >/=3 for OSA: no. Need lung cancer screening (men > 55):no. Need AAA screening (men 65-74, >100 cigarettes):no At risk for skin cancer: no. Need HCV Screening: no. Need STI Screening: no. Fall in the last 12 months:no  PMH/Problem List: does not have a problem list on file.   has a past medical history of  Hypertension.  [facility]  Problem Relation Age of Onset  . Kidney failure Father   . Aneurysm Father   . Hypertension Other    Family history of heart disease before age of 60 70s: no. Family history of stroke: no. Family history of cancer: no.  SH Social History   Tobacco Use  . Smoking status: Never Smoker  . Smokeless tobacco: Never Used  Substance Use Topics  . Alcohol use: No  . Drug use: No     Review of Systems  Musculoskeletal: Positive for arthralgias.  All other systems reviewed and are negative.       Objective:   Physical Exam Vitals:   03/25/17 1552  BP: (!) 158/68  Pulse: 88  Temp: 98.5 F (36.9 C)  TempSrc: Oral  SpO2: 95%  Weight: (!) 403 lb (182.8 kg)  Height: 6' (1.829 m)   Body mass index is 54.66 kg/m.  GEN: Obese man, appears well, no apparent distress. Head: normocephalic and atraumatic  Eyes: conjunctiva without injection, sclera anicteric Ears: external ear and ear canal normal Nares: no rhinorrhea, congestion or erythema  Oropharynx: mmm without erythema or exudation HEM: negative for cervical or periauricular lymphadenopathies CVS: RRR, nl s1 & s2, no murmurs, no edema,  2+ DP & PT bil RESP: no IWOB, good air movement bilaterally, CTAB GI: BS present & normal, soft, NTND, no guarding, no rebound, no mass GU: no suprapubic or CVA tenderness MSK: no focal  tenderness or notable swelling, left knee crepitus, good ROM, no joint laxity noted. No muscles wasting or swelling. SKIN: no apparent skin lesion ENDO: negative thyromegally NEURO: alert and oiented appropriately, no gross deficits  PSYCH: euthymic mood with congruent affect    Assessment & Plan:  1. Screening for diabetes mellitus  - Hemoglobin A1c  2. Screening for HIV (human immunodeficiency virus)  - HIV antibody  3. Screening for hyperlipidemia  - Lipid panel  4. Chronic pain of left knee Patient is an obese man who complaints of left knee pain. On  exam some crepitus noted suggestive of arthritic change. No joint laxity, and good ROM. No muscles wasting or knee swelling. Patient reports diagnosis of polio when he was younger. Unclear. Pain is likely secondary to stress for weight. - DG Knee Complete 4 Views Left; Future - meloxicam (MOBIC) 15 MG tablet; Take 1 tablet (15 mg total) by mouth daily.   -Knee brace for stress off loading - Knee exercise given   5. Healthcare maintenance  - CBC with Differential - CMP14+EGFR     Marjie Skiff, MD PGY-2 Pager [phone] 03/25/17  6:02 PM

## 2017-03-26 LAB — CBC WITH DIFFERENTIAL/PLATELET
BASOS: 0 %
Basophils Absolute: 0 10*3/uL (ref 0.0–0.2)
EOS (ABSOLUTE): 0.1 10*3/uL (ref 0.0–0.4)
EOS: 1 %
HEMATOCRIT: 38.8 % (ref 37.5–51.0)
Hemoglobin: 12.4 g/dL — ABNORMAL LOW (ref 13.0–17.7)
IMMATURE GRANULOCYTES: 1 %
Immature Grans (Abs): 0 10*3/uL (ref 0.0–0.1)
Lymphocytes Absolute: 1.7 10*3/uL (ref 0.7–3.1)
Lymphs: 26 %
MCH: 27.1 pg (ref 26.6–33.0)
MCHC: 32 g/dL (ref 31.5–35.7)
MCV: 85 fL (ref 79–97)
MONOS ABS: 0.5 10*3/uL (ref 0.1–0.9)
Monocytes: 8 %
NEUTROS ABS: 4.3 10*3/uL (ref 1.4–7.0)
NEUTROS PCT: 64 %
Platelets: 297 10*3/uL (ref 150–379)
RBC: 4.57 x10E6/uL (ref 4.14–5.80)
RDW: 14.8 % (ref 12.3–15.4)
WBC: 6.6 10*3/uL (ref 3.4–10.8)

## 2017-03-26 LAB — CMP14+EGFR
ALK PHOS: 74 IU/L (ref 39–117)
ALT: 18 IU/L (ref 0–44)
AST: 18 IU/L (ref 0–40)
Albumin/Globulin Ratio: 1.4 (ref 1.2–2.2)
Albumin: 4.1 g/dL (ref 3.5–5.5)
BUN/Creatinine Ratio: 10 (ref 9–20)
BUN: 11 mg/dL (ref 6–20)
CHLORIDE: 106 mmol/L (ref 96–106)
CO2: 22 mmol/L (ref 20–29)
Calcium: 9.1 mg/dL (ref 8.7–10.2)
Creatinine, Ser: 1.09 mg/dL (ref 0.76–1.27)
GFR calc Af Amer: 98 mL/min/{1.73_m2} (ref >59)
GFR calc non Af Amer: 85 mL/min/{1.73_m2} (ref >59)
GLUCOSE: 106 mg/dL — AB (ref 65–99)
Globulin, Total: 3 g/dL (ref 1.5–4.5)
Potassium: 4.3 mmol/L (ref 3.5–5.2)
Sodium: 143 mmol/L (ref 134–144)
TOTAL PROTEIN: 7.1 g/dL (ref 6.0–8.5)

## 2017-03-26 LAB — LIPID PANEL
CHOL/HDL RATIO: 4.4 ratio (ref 0.0–5.0)
Cholesterol, Total: 173 mg/dL (ref 100–199)
HDL: 39 mg/dL — AB (ref >39)
LDL Calculated: 74 mg/dL (ref 0–99)
Triglycerides: 301 mg/dL — ABNORMAL HIGH (ref 0–149)
VLDL CHOLESTEROL CAL: 60 mg/dL — AB (ref 5–40)

## 2017-03-26 LAB — HEMOGLOBIN A1C
ESTIMATED AVERAGE GLUCOSE: 146 mg/dL
HEMOGLOBIN A1C: 6.7 % — AB (ref 4.8–5.6)

## 2017-03-26 LAB — HIV ANTIBODY (ROUTINE TESTING W REFLEX): HIV SCREEN 4TH GENERATION: NONREACTIVE

## 2017-03-29 ENCOUNTER — Ambulatory Visit (HOSPITAL_COMMUNITY)
Admission: RE | Admit: 2017-03-29 | Discharge: 2017-03-29 | Disposition: A | Discharge status: Home / Self Care | Payer: BLUE CROSS/BLUE SHIELD | Source: Ambulatory Visit | Attending: Family Medicine | Admitting: Family Medicine

## 2017-03-29 DIAGNOSIS — M769 Unspecified enthesopathy, lower limb, excluding foot: Secondary | ICD-10-CM | POA: Diagnosis not present

## 2017-03-29 DIAGNOSIS — G8929 Other chronic pain: Secondary | ICD-10-CM | POA: Diagnosis not present

## 2017-03-29 DIAGNOSIS — M25562 Pain in left knee: Secondary | ICD-10-CM | POA: Insufficient documentation

## 2017-03-31 ENCOUNTER — Other Ambulatory Visit: Payer: Self-pay | Admitting: Family Medicine

## 2017-03-31 DIAGNOSIS — E119 Type 2 diabetes mellitus without complications: Secondary | ICD-10-CM | POA: Insufficient documentation

## 2017-03-31 MED ORDER — METFORMIN HCL 500 MG PO TABS: 500.0000 mg | ORAL_TABLET | Freq: Every day | ORAL | 0 refills | Status: DC

## 2017-04-05 ENCOUNTER — Encounter: Payer: Self-pay | Admitting: Family Medicine

## 2017-04-05 ENCOUNTER — Other Ambulatory Visit: Payer: Self-pay | Admitting: Family Medicine

## 2017-04-05 DIAGNOSIS — E119 Type 2 diabetes mellitus without complications: Secondary | ICD-10-CM

## 2017-04-05 MED ORDER — METFORMIN HCL 500 MG PO TABS: 500.0000 mg | ORAL_TABLET | Freq: Two times a day (BID) | ORAL | 2 refills | Status: DC

## 2017-04-21 ENCOUNTER — Emergency Department (HOSPITAL_COMMUNITY)
Admission: EM | Admit: 2017-04-21 | Discharge: 2017-04-22 | Disposition: A | Discharge status: Home / Self Care | Payer: BLUE CROSS/BLUE SHIELD | Attending: Emergency Medicine | Admitting: Emergency Medicine

## 2017-04-21 ENCOUNTER — Encounter: Payer: Self-pay | Admitting: Family Medicine

## 2017-04-21 ENCOUNTER — Other Ambulatory Visit: Payer: Self-pay

## 2017-04-21 ENCOUNTER — Emergency Department (HOSPITAL_COMMUNITY): Payer: BLUE CROSS/BLUE SHIELD

## 2017-04-21 ENCOUNTER — Encounter (HOSPITAL_COMMUNITY): Payer: Self-pay | Admitting: Emergency Medicine

## 2017-04-21 DIAGNOSIS — Z5321 Procedure and treatment not carried out due to patient leaving prior to being seen by health care provider: Secondary | ICD-10-CM | POA: Diagnosis not present

## 2017-04-21 DIAGNOSIS — R079 Chest pain, unspecified: Secondary | ICD-10-CM | POA: Insufficient documentation

## 2017-04-21 HISTORY — DX: Type 2 diabetes mellitus without complications: E11.9

## 2017-04-21 LAB — CBC
HCT: 40.3 % (ref 39.0–52.0)
Hemoglobin: 12.6 g/dL — ABNORMAL LOW (ref 13.0–17.0)
MCH: 26.9 pg (ref 26.0–34.0)
MCHC: 31.3 g/dL (ref 30.0–36.0)
MCV: 85.9 fL (ref 78.0–100.0)
PLATELETS: 274 10*3/uL (ref 150–400)
RBC: 4.69 MIL/uL (ref 4.22–5.81)
RDW: 14 % (ref 11.5–15.5)
WBC: 6.8 10*3/uL (ref 4.0–10.5)

## 2017-04-21 LAB — BASIC METABOLIC PANEL
Anion gap: 8 (ref 5–15)
BUN: 10 mg/dL (ref 6–20)
CALCIUM: 9.3 mg/dL (ref 8.9–10.3)
CO2: 27 mmol/L (ref 22–32)
CREATININE: 0.86 mg/dL (ref 0.61–1.24)
Chloride: 104 mmol/L (ref 101–111)
Glucose, Bld: 96 mg/dL (ref 65–99)
Potassium: 4.6 mmol/L (ref 3.5–5.1)
Sodium: 139 mmol/L (ref 135–145)

## 2017-04-21 LAB — I-STAT TROPONIN, ED: TROPONIN I, POC: 0 ng/mL (ref 0.00–0.08)

## 2017-04-21 NOTE — ED Notes (Signed)
Pt updated on wait for treatment room.  States he will get results on MyChart.  Encouraged pt to stay and he states he is going to leave.

## 2017-04-21 NOTE — ED Triage Notes (Signed)
C/o generalized pain across chest x 2 weeks.  Pain worse in the mornings- feels like "spasms" and sometimes after eating.  Denies sob, nausea, and vomiting.

## 2017-04-22 MED ORDER — KETOROLAC TROMETHAMINE 30 MG/ML IJ SOLN: 30.0000 mg | Freq: Once | INTRAMUSCULAR | Status: DC

## 2017-04-22 MED ORDER — GI COCKTAIL ~~LOC~~
30.0000 mL | Freq: Once | ORAL | Status: DC
Start: 2017-04-22 — End: 2017-04-22

## 2017-04-22 NOTE — ED Notes (Signed)
Pt states he will wait a little bit longer.

## 2017-04-22 NOTE — ED Notes (Signed)
Pt called x4 for hallway bed. No answer

## 2017-05-31 ENCOUNTER — Encounter: Payer: Self-pay | Admitting: Family Medicine

## 2017-06-11 ENCOUNTER — Ambulatory Visit (INDEPENDENT_AMBULATORY_CARE_PROVIDER_SITE_OTHER): Payer: BLUE CROSS/BLUE SHIELD | Admitting: Internal Medicine

## 2017-06-11 ENCOUNTER — Encounter: Payer: Self-pay | Admitting: Internal Medicine

## 2017-06-11 ENCOUNTER — Other Ambulatory Visit: Payer: Self-pay

## 2017-06-11 VITALS — BP 134/82 | HR 77 | Temp 98.5°F | Ht 72.0 in | Wt 391.0 lb

## 2017-06-11 DIAGNOSIS — Z23 Encounter for immunization: Secondary | ICD-10-CM | POA: Diagnosis not present

## 2017-06-11 DIAGNOSIS — Z794 Long term (current) use of insulin: Secondary | ICD-10-CM | POA: Diagnosis not present

## 2017-06-11 DIAGNOSIS — R030 Elevated blood-pressure reading, without diagnosis of hypertension: Secondary | ICD-10-CM | POA: Insufficient documentation

## 2017-06-11 DIAGNOSIS — E0800 Diabetes mellitus due to underlying condition with hyperosmolarity without nonketotic hyperglycemic-hyperosmolar coma (NKHHC): Secondary | ICD-10-CM

## 2017-06-11 LAB — POCT UA - MICROALBUMIN
Albumin/Creatinine Ratio, Urine, POC: <30
CREATININE, POC: 300 mg/dL
Microalbumin Ur, POC: 30 mg/L

## 2017-06-11 MED ORDER — METFORMIN HCL 1000 MG PO TABS: 1000.0000 mg | ORAL_TABLET | Freq: Two times a day (BID) | ORAL | 3 refills | Status: DC

## 2017-06-11 NOTE — Patient Instructions (Signed)
It was so nice to meet you!  Your blood pressure looked fine today, so we do not need to start you on a medication at this time  For your diabetes- we gave you the pneumonia vaccine today. We also did your foot exam (which was normal) and checked your urine to make sure that you are not showing any signs of kidney damage. I will call you with these lab results.  Please follow-up with Dr. Sydnee Cabal in the next 2-3 months.  -Dr. Nancy Marus

## 2017-06-11 NOTE — Progress Notes (Signed)
   [facility] Phone: [phone]  Subjective:  Lincoln is a 42 year old male presenting to clinic for follow-up of his T2DM and blood pressure.  T2DM: Started on Metformin 04/05/17. Has not had any issues with this. No side effects. No polyuria or polydipsia. He has lost 12lb since then. Not checking blood sugars at home.  Elevated BP: Has had a few elevated BPs in the past. Never formally diagnosed with hypertension. PCP has been following this. Not on any meds. Does not check his blood pressure at home. States he does not try to eat a low salt diet. He is very active at work and walks around all day.  ROS: See HPI for pertinent positives and negatives  Past Medical History- T2DM  Family history reviewed for today's visit. No changes.  Social history- patient is a never smoker  Objective: BP 134/82   Pulse 77   Temp 98.5 F (36.9 C) (Oral)   Ht 6' (1.829 m)   Wt (!) 391 lb (177.4 kg)   SpO2 95%   BMI 53.03 kg/m  Gen: NAD, alert, cooperative with exam HEENT: NCAT, EOMI, MMM Neck: FROM, supple CV: RRR, no murmur Resp: CTABL, no wheezes, normal work of breathing Msk: No edema, warm, normal tone, moves UE/LE spontaneously Feet: No deformities, no ulcerations, no other skin breakdown bilaterally; intact to touch and monofilament testing bilaterally; PT and DP pulses intact bilaterally  Assessment/Plan: T2DM: Well-controlled. Last A1c 6.7%. Has lost 12lb since starting Metformin. - Increase Metformin to  bid - Diabetic foot exam performed today and was normal - Pneumococcal vaccine performed today - Urine microalbumin performed today - Follow-up with PCP in 2-3 months for recheck of A1c  Elevated BP: Has had a few sporadic elevated BPs. BP 134/82 in clinic today. Likely improved due to 12lb weight loss. - Will hold off on starting medication at this time - Follow-up with PCP in 2-3 months.   Willadean Carol, MD PGY-3

## 2017-06-11 NOTE — Assessment & Plan Note (Signed)
Has had a few sporadic elevated BPs. BP 134/82 in clinic today. Likely improved due to 12lb weight loss. - Will hold off on starting medication at this time - Follow-up with PCP in 2-3 months.

## 2017-06-11 NOTE — Assessment & Plan Note (Signed)
Well-controlled. Last A1c 6.7%. Has lost 12lb since starting Metformin. - Increase Metformin to  bid - Diabetic foot exam performed today and was normal - Pneumococcal vaccine performed today - Urine microalbumin performed today - Follow-up with PCP in 2-3 months for recheck of A1c

## 2017-09-17 ENCOUNTER — Other Ambulatory Visit: Payer: Self-pay

## 2017-09-17 ENCOUNTER — Encounter: Payer: Self-pay | Admitting: Family Medicine

## 2017-09-17 ENCOUNTER — Ambulatory Visit (INDEPENDENT_AMBULATORY_CARE_PROVIDER_SITE_OTHER): Payer: Self-pay | Admitting: Family Medicine

## 2017-09-17 VITALS — BP 122/74 | HR 70 | Temp 98.6°F | Ht 72.0 in | Wt 385.0 lb

## 2017-09-17 DIAGNOSIS — R030 Elevated blood-pressure reading, without diagnosis of hypertension: Secondary | ICD-10-CM

## 2017-09-17 DIAGNOSIS — E119 Type 2 diabetes mellitus without complications: Secondary | ICD-10-CM

## 2017-09-17 LAB — POCT GLYCOSYLATED HEMOGLOBIN (HGB A1C): HBA1C, POC (CONTROLLED DIABETIC RANGE): 5.9 % (ref 0.0–7.0)

## 2017-09-17 NOTE — Progress Notes (Signed)
   Subjective:    Patient ID: Anthony Collier, male    DOB: Jul 22, 1975, 42 y.o.   MRN: [id number]   CC:  HPI:   Smoking status reviewed   ROS: all other systems were reviewed and are negative other than in the HPI   Past Medical History:  Diagnosis Date  . Diabetes mellitus without complication (HCC)   . Hypertension     No past surgical history on file.  Past medical history, surgical, family, and social history reviewed and updated in the EMR as appropriate.  Objective:  BP 122/74   Pulse 70   Temp 98.6 F (37 C) (Oral)   Ht 6' (1.829 m)   Wt (!) 385 lb (174.6 kg)   SpO2 97%   BMI 52.22 kg/m   Vitals and nursing note reviewed  General: NAD, pleasant, able to participate in exam Cardiac: RRR, normal heart sounds, no murmurs. 2+ radial and PT pulses bilaterally Respiratory: CTAB, normal effort, No wheezes, rales or rhonchi Abdomen: soft, nontender, nondistended, no hepatic or splenomegaly, +BS Extremities: no edema or cyanosis. WWP. Skin: warm and dry, no rashes noted Neuro: alert and oriented x4, no focal deficits Psych: Normal affect and mood   Assessment & Plan:    No problem-specific Assessment & Plan notes found for this encounter.    Lovena NeighboursAbdoulaye Arrianna Catala, MD [facility] PGY-3

## 2017-09-17 NOTE — Progress Notes (Signed)
   Subjective:    Patient ID: Anthony Collier, male    DOB: Jun 30, 1975, 42 y.o.   MRN: [id number]   CC: Follow up for T2DM  HPI: Patient is a 42 yo male with a past medical history significant for T2DM who present today for A1c check. Patient reports that he has been taking 1000 mg daily instead of the 2000 mg initially because of diarrhea. Patient has also make some changes to his diet and has lost 20 lbs since I last saw him in February. He is trying to exercise more, and reports that knee pain has improved since last seen in the office. He has not needed Meloxicam or other NSAIDs in the past few weeks. Patient denies any chest pain, SOB, abdominal pain, nausea, vomiting, polyuria and polydipsia.  Smoking status reviewed   ROS: all other systems were reviewed and are negative other than in the HPI   Past Medical History:  Diagnosis Date  . Diabetes mellitus without complication (HCC)   . Hypertension     History reviewed. No pertinent surgical history.  Past medical history, surgical, family, and social history reviewed and updated in the EMR as appropriate.  Objective:  BP 122/74   Pulse 70   Temp 98.6 F (37 C) (Oral)   Ht 6' (1.829 m)   Wt (!) 385 lb (174.6 kg)   SpO2 97%   BMI 52.22 kg/m   Vitals and nursing note reviewed  General: Obese man, NAD, pleasant, able to participate in exam Cardiac: RRR, normal heart sounds, no murmurs. 2+ radial and PT pulses bilaterally Respiratory: CTAB, normal effort, No wheezes, rales or rhonchi Abdomen: soft, nontender, nondistended, no hepatic or splenomegaly, +BS Extremities: no edema or cyanosis. WWP. Skin: warm and dry, no rashes noted Neuro: alert and oriented x4, no focal deficits Psych: Normal affect and mood   Assessment & Plan:   Diabetes mellitus (HCC) A1c today is 5.9 down from 6.7. Patient has made therapeutic lifestyle changes and continue to be on metformin 1000 mg with good tolerance. Will continue current plan,  and recheck A1c in 3 months. Encourage weight loss which should further help maintaining good glycemic control. Will reassess metformin dose at next office visit.  Elevated BP without diagnosis of hypertension BP today is 124/74 at goal. Patient is obese but has made some change and continue to lose weight 20 lbs since february. Will continue to monitor, no need to initiate oral agent.  Obesity, morbid (HCC) Patient has lost 20 lbs since initial office visit in February. He has made significant change to his diet and exercise regimen reflected in his A1c. Patient plan to continue working towards significant weight loss given age and comorbidities. Will continue to encourage him. Will consider referral to nutrition.    Lovena NeighboursAbdoulaye Miakoda Mcmillion, MD [facility] PGY-3

## 2017-09-17 NOTE — Assessment & Plan Note (Signed)
A1c today is 5.9 down from 6.7. Patient has made therapeutic lifestyle changes and continue to be on metformin 1000 mg with good tolerance. Will continue current plan, and recheck A1c in 3 months. Encourage weight loss which should further help maintaining good glycemic control. Will reassess metformin dose at next office visit.

## 2017-09-17 NOTE — Patient Instructions (Signed)
It was great seeing you today! We have addressed the following issues today  1. Your A1c is 5.9 down from 6.7. Very good job on lowering he number. Continue with metformin 1000 mg a day. Work on exercise and Diet as you have been doing. 2. Your blood pressure is normal.  3. You have lost 20 lbs in the past 6 months which showed that you have been taking your health seriously. I will encourage to keep working hard on the weight loss. 4. I will see you in 3 months for another A1c check when we will consider medication change.  If we did any lab work today, and the results require attention, either me or my nurse will get in touch with you. If everything is normal, you will get a letter in mail and a message via . If you don't hear from us in two weeks, please give us a call. Otherwise, we look forward to seeing you again at your next visit. If you have any questions or concerns before then, please call the clinic at [phone].  Please bring all your medications to every doctors visit  Sign up for My Chart to have easy access to your labs results, and communication with your Primary care physician. Please ask Front Desk for some assistance.   Please check-out at the front desk before leaving the clinic.    Take Care,   Dr. Sydnee Cabaliallo

## 2017-09-17 NOTE — Assessment & Plan Note (Addendum)
BP today is 124/74 at goal. Patient is obese but has made some change and continue to lose weight 20 lbs since february. Will continue to monitor, no need to initiate oral agent.

## 2017-09-17 NOTE — Assessment & Plan Note (Signed)
Patient has lost 20 lbs since initial office visit in February. He has made significant change to his diet and exercise regimen reflected in his A1c. Patient plan to continue working towards significant weight loss given age and comorbidities. Will continue to encourage him. Will consider referral to nutrition.

## 2018-03-01 ENCOUNTER — Encounter: Payer: Self-pay | Admitting: Family Medicine

## 2018-03-02 ENCOUNTER — Ambulatory Visit (INDEPENDENT_AMBULATORY_CARE_PROVIDER_SITE_OTHER): Payer: Managed Care, Other (non HMO) | Admitting: Family Medicine

## 2018-03-02 ENCOUNTER — Other Ambulatory Visit: Payer: Self-pay

## 2018-03-02 VITALS — BP 130/72 | HR 82 | Temp 98.6°F | Ht 74.0 in | Wt >= 6400 oz

## 2018-03-02 DIAGNOSIS — J069 Acute upper respiratory infection, unspecified: Secondary | ICD-10-CM

## 2018-03-02 DIAGNOSIS — B9789 Other viral agents as the cause of diseases classified elsewhere: Secondary | ICD-10-CM

## 2018-03-02 DIAGNOSIS — Z23 Encounter for immunization: Secondary | ICD-10-CM

## 2018-03-02 NOTE — Patient Instructions (Signed)
Viral Respiratory Infection  A respiratory infection is an illness that affects part of the respiratory system, such as the lungs, nose, or throat. A respiratory infection that is caused by a virus is called a viral respiratory infection.  Common types of viral respiratory infections include:  · A cold.  · The flu (influenza).  · A respiratory syncytial virus (RSV) infection.  What are the causes?  This condition is caused by a virus.  What are the signs or symptoms?  Symptoms of this condition include:  · A stuffy or runny nose.  · Yellow or green nasal discharge.  · A cough.  · Sneezing.  · Fatigue.  · Achy muscles.  · A sore throat.  · Sweating or chills.  · A fever.  · A headache.  How is this diagnosed?  This condition may be diagnosed based on:  · Your symptoms.  · A physical exam.  · Testing of nasal swabs.  How is this treated?  This condition may be treated with medicines, such as:  · Antiviral medicine. This may shorten the length of time a person has symptoms.  · Expectorants. These make it easier to cough up mucus.  · Decongestant nasal sprays.  · Acetaminophen or NSAIDs to relieve fever and pain.  Antibiotic medicines are not prescribed for viral infections. This is because antibiotics are designed to kill bacteria. They are not effective against viruses.  Follow these instructions at home:    Managing pain and congestion  · Take over-the-counter and prescription medicines only as told by your health care provider.  · If you have a sore throat, gargle with a salt-water mixture 3-4 times a day or as needed. To make a salt-water mixture, completely dissolve ½-1 tsp of salt in 1 cup of warm water.  · Use nose drops made from salt water to ease congestion and soften raw skin around your nose.  · Drink enough fluid to keep your urine pale yellow. This helps prevent dehydration and helps loosen up mucus.  General instructions  · Rest as much as possible.  · Do not drink alcohol.  · Do not use any products  that contain nicotine or tobacco, such as cigarettes and e-cigarettes. If you need help quitting, ask your health care provider.  · Keep all follow-up visits as told by your health care provider. This is important.  How is this prevented?    · Get an annual flu shot. You may get the flu shot in late summer, fall, or winter. Ask your health care provider when you should get your flu shot.  · Avoid exposing others to your respiratory infection.  ? Stay home from work or school as told by your health care provider.  ? Wash your hands with soap and water often, especially after you cough or sneeze. If soap and water are not available, use alcohol-based hand sanitizer.  · Avoid contact with people who are sick during cold and flu season. This is generally fall and winter.  Contact a health care provider if:  · Your symptoms last for 10 days or longer.  · Your symptoms get worse over time.  · You have a fever.  · You have severe sinus pain in your face or forehead.  · The glands in your jaw or neck become very swollen.  Get help right away if you:  · Feel pain or pressure in your chest.  · Have shortness of breath.  · Faint or feel like   you will faint.  · Have severe and persistent vomiting.  · Feel confused or disoriented.  Summary  · A respiratory infection is an illness that affects part of the respiratory system, such as the lungs, nose, or throat. A respiratory infection that is caused by a virus is called a viral respiratory infection.  · Common types of viral respiratory infections are a cold, influenza, and respiratory syncytial virus (RSV) infection.  · Symptoms of this condition include a stuffy or runny nose, cough, sneezing, fatigue, achy muscles, sore throat, and fevers or chills.  · Antibiotic medicines are not prescribed for viral infections. This is because antibiotics are designed to kill bacteria. They are not effective against viruses.  This information is not intended to replace advice given to you by  your health care provider. Make sure you discuss any questions you have with your health care provider.  Document Released: 10/22/2004 Document Revised: 02/22/2017 Document Reviewed: 02/22/2017  Elsevier Interactive Patient Education © 2019 [employer]

## 2018-03-02 NOTE — Progress Notes (Signed)
    Subjective:  Anthony Collier is a 43 y.o. male who presents to the [facility] today with a chief complaint of cold like symptoms.   HPI:  Patient states that he has had cold-like symptoms since Sunday.  It started with nasal congestion, rhinorrhea, cough and some central chest discomfort.  This is been constant since Sunday not acutely worsened but also not improving.  He states that his cough is slightly productive of brownish sputum without purulence.  He has had some subjective fevers and chills.  He states that his chest discomfort is now migrated over to the left side.  It is worse with laying down.  It is not exacerbated by exertion.  He does not have any shortness of breath or nausea.   He has been taking over-the-counter cough medication such as NyQuil and Mucinex without much relief.  ROS: Per HPI   Objective:  Physical Exam: BP 130/72   Pulse 82   Temp 98.6 F (37 C) (Oral)   Ht 6\' 2"  (1.88 m)   Wt (!) 405 lb (183.7 kg)   SpO2 97%   BMI 52.00 kg/m   Gen: NAD, resting comfortably HEENT: [place], AT. EOMI. TMs pearly bilaterally. Oropharynx slightly erythematous without edema or exudates. Nasal mucosa boggy L>R. Neck: no cervical lymphadenopathy CV: RRR with no murmurs appreciated Pulm: NWOB, CTAB with no crackles, wheezes, or rhonchi GI: Normal bowel sounds present. Soft, Nontender, Nondistended. MSK: no edema, cyanosis, or clubbing noted. L ribs slightly tender with palpation, reproducing pain. Skin: warm, dry Neuro: grossly normal, moves all extremities Psych: Normal affect and thought content   Assessment/Plan:  1. Viral URI with cough Patient has had 3 days of cold-like symptoms.  His vital signs are stable and he is afebrile here today.  His chest discomfort is likely due to a his cough as his chest pain is reproducible on palpation with the pain character being consistent with a discomfort that has been present for 3 days.  He was given return precautions for both his  chest pain and his cough.  Recommended supportive care at home.  Patient voiced good understanding.   Leland Her, DO PGY-3, [facility] 03/02/2018 8:49 AM

## 2018-06-08 ENCOUNTER — Encounter: Payer: Self-pay | Admitting: Family Medicine

## 2018-06-08 ENCOUNTER — Other Ambulatory Visit: Payer: Self-pay

## 2018-06-08 ENCOUNTER — Ambulatory Visit (INDEPENDENT_AMBULATORY_CARE_PROVIDER_SITE_OTHER): Payer: BLUE CROSS/BLUE SHIELD | Admitting: Family Medicine

## 2018-06-08 VITALS — BP 120/66 | HR 69 | Ht 74.0 in | Wt >= 6400 oz

## 2018-06-08 DIAGNOSIS — E119 Type 2 diabetes mellitus without complications: Secondary | ICD-10-CM

## 2018-06-08 LAB — POCT GLYCOSYLATED HEMOGLOBIN (HGB A1C): HbA1c, POC (controlled diabetic range): 6.8 % (ref 0.0–7.0)

## 2018-06-08 MED ORDER — GLIPIZIDE 5 MG PO TABS: 5.0000 mg | ORAL_TABLET | Freq: Every day | ORAL | 3 refills | Status: DC

## 2018-06-08 NOTE — Progress Notes (Signed)
   Subjective:    Patient ID: Anthony Collier, male    DOB: 16-Mar-1975, 43 y.o.   MRN: [id number]   CC: T2DM follow up   HPI: Patient is a 43 yo male with a past medical history significant for T2DM who presents today for management follow up. Patient reports he has been quarantine at home for the past two month and has not been able to work on his diet or exercise routine. Patient also reports that he has not been able to tolerate Metformin due to significant diarrhea. He has only been taking it intermittently. He denies any polyuria or polydipsia since last office. He has no other complaints today.   Smoking status reviewed   ROS: all other systems were reviewed and are negative other than in the HPI   Past Medical History:  Diagnosis Date  . Diabetes mellitus without complication (HCC)   . Hypertension     History reviewed. No pertinent surgical history.  Past medical history, surgical, family, and social history reviewed and updated in the EMR as appropriate.  Objective:  BP 120/66   Pulse 69   Ht 6\' 2"  (1.88 m)   Wt (!) 410 lb (186 kg)   SpO2 96%   BMI 52.64 kg/m   Vitals and nursing note reviewed  General: Obese man, NAD, pleasant, able to participate in exam Cardiac: RRR, normal heart sounds, no murmurs. 2+ radial and PT pulses bilaterally Respiratory: CTAB, normal effort, No wheezes, rales or rhonchi Abdomen: soft, nontender, nondistended, no hepatic or splenomegaly, +BS Extremities: no edema or cyanosis. WWP. Skin: warm and dry, no rashes noted Neuro: alert and oriented x4, no focal deficits Psych: Normal affect and mood   Assessment & Plan:   Diabetes mellitus (HCC) A1c today 6.8 up from 5.9. Patient has been non adherent to Metformin due to GI side effect. Patient has not been able to work on exercise and diet since being quarantine 2 months ago. Consider switching patient to Metformin XR but felt reluctance to tried it. Patient could have benefited given  weight. Will start patient on sulfonylurea today. We will also to blood work, patient is not on a statin and will also initiate moderate intensity statin given diabetes. Normal foot exam. --Start Glipizide 5 mg daily --Order CBC, BMP, lipid panel --Will start patient on atorvastatin 40 mg daily --Follow up in 3 months for A1 check or sooner as needed  Obesity, morbid (HCC) Patient has gained weight since last time he saw me in the office. BMI>50. Discussed working on Diet and exercise. Will refer to Diabetic Education. Will discuss Bariatric surgery at next OV if no significant weight change.    Lovena Neighbours, MD [facility] PGY-3

## 2018-06-08 NOTE — Assessment & Plan Note (Signed)
Patient has gained weight since last time he saw me in the office. BMI>50. Discussed working on Diet and exercise. Will refer to Diabetic Education. Will discuss Bariatric surgery at next OV if no significant weight change.

## 2018-06-08 NOTE — Patient Instructions (Signed)
It was great seeing you today! We have addressed the following issues today  1. Your A1c is 6.8, we will switch you to glipizide 5 mg once a day from metformin given intolerance.  2. I will do blood work today and will follow up on the results. 3. I refer you to diabetic education they will call you and schedule a meeting.  If we did any lab work today, and the results require attention, either me or my nurse will get in touch with you. If everything is normal, you will get a letter in mail and a message via . If you don't hear from [place] in two weeks, please give [place] a call. Otherwise, we look forward to seeing you again at your next visit. If you have any questions or concerns before then, please call the clinic at [phone].  Please bring all your medications to every doctors visit  Sign up for My Chart to have easy access to your labs results, and communication with your Primary care physician. Please ask Front Desk for some assistance.   Please check-out at the front desk before leaving the clinic.    Take Care,   Dr. Sydnee Cabal   Diet Recommendations for Diabetes   Starchy (carb) foods: Bread, rice, pasta, potatoes, corn, cereal, grits, crackers, bagels, muffins, all baked goods.  (Fruits, milk, and yogurt also have carbohydrate, but most of these foods will not spike your blood sugar as the starchy foods will.)  A few fruits do cause high blood sugars; use small portions of bananas (limit to 1/2 at a time), grapes, watermelon, oranges, and most tropical fruits.    Protein foods: Meat, fish, poultry, eggs, dairy foods, and beans such as pinto and kidney beans (beans also provide carbohydrate).   1. Eat at least 3 meals and 1-2 snacks per day. Never go more than 4-5 hours while awake without eating. Eat breakfast within the first hour of getting up.   2. Limit starchy foods to TWO per meal and ONE per snack. ONE portion of a starchy  food is equal to the following:   - ONE slice of  bread (or its equivalent, such as half of a hamburger bun).   - 1/2 cup of a "scoopable" starchy food such as potatoes or rice.   - 15 grams of carbohydrate as shown on food label.  3. Include at every meal: a protein food, a carb food, and vegetables and/or fruit.   - Obtain twice the volume of veg's as protein or carbohydrate foods for both lunch and dinner.   - Fresh or frozen veg's are best.   - Keep frozen veg's on hand for a quick vegetable serving.

## 2018-06-08 NOTE — Assessment & Plan Note (Addendum)
A1c today 6.8 up from 5.9. Patient has been non adherent to Metformin due to GI side effect. Patient has not been able to work on exercise and diet since being quarantine 2 months ago. Consider switching patient to Metformin XR but felt reluctance to tried it. Patient could have benefited given weight. Will start patient on sulfonylurea today. We will also to blood work, patient is not on a statin and will also initiate moderate intensity statin given diabetes. Normal foot exam. --Start Glipizide 5 mg daily --Order CBC, BMP, lipid panel --Will start patient on atorvastatin 40 mg daily --Follow up in 3 months for A1 check or sooner as needed

## 2018-06-09 LAB — CBC WITH DIFFERENTIAL/PLATELET
Basophils Absolute: 0 10*3/uL (ref 0.0–0.2)
Basos: 1 %
EOS (ABSOLUTE): 0.1 10*3/uL (ref 0.0–0.4)
Eos: 1 %
Hematocrit: 39.1 % (ref 37.5–51.0)
Hemoglobin: 12.4 g/dL — ABNORMAL LOW (ref 13.0–17.7)
Immature Grans (Abs): 0 10*3/uL (ref 0.0–0.1)
Immature Granulocytes: 1 %
Lymphocytes Absolute: 1.7 10*3/uL (ref 0.7–3.1)
Lymphs: 27 %
MCH: 27 pg (ref 26.6–33.0)
MCHC: 31.7 g/dL (ref 31.5–35.7)
MCV: 85 fL (ref 79–97)
Monocytes Absolute: 0.6 10*3/uL (ref 0.1–0.9)
Monocytes: 9 %
Neutrophils Absolute: 3.9 10*3/uL (ref 1.4–7.0)
Neutrophils: 61 %
Platelets: 249 10*3/uL (ref 150–450)
RBC: 4.6 x10E6/uL (ref 4.14–5.80)
RDW: 14.4 % (ref 11.6–15.4)
WBC: 6.3 10*3/uL (ref 3.4–10.8)

## 2018-06-09 LAB — BASIC METABOLIC PANEL
BUN/Creatinine Ratio: 10 (ref 9–20)
BUN: 10 mg/dL (ref 6–24)
CO2: 22 mmol/L (ref 20–29)
Calcium: 9.1 mg/dL (ref 8.7–10.2)
Chloride: 104 mmol/L (ref 96–106)
Creatinine, Ser: 0.98 mg/dL (ref 0.76–1.27)
GFR calc Af Amer: 111 mL/min/{1.73_m2} (ref >59)
GFR calc non Af Amer: 96 mL/min/{1.73_m2} (ref >59)
Glucose: 115 mg/dL — ABNORMAL HIGH (ref 65–99)
Potassium: 4.4 mmol/L (ref 3.5–5.2)
Sodium: 141 mmol/L (ref 134–144)

## 2018-06-09 LAB — LIPID PANEL
Chol/HDL Ratio: 4.2 ratio (ref 0.0–5.0)
Cholesterol, Total: 187 mg/dL (ref 100–199)
HDL: 45 mg/dL (ref >39)
LDL Calculated: 125 mg/dL — ABNORMAL HIGH (ref 0–99)
Triglycerides: 84 mg/dL (ref 0–149)
VLDL Cholesterol Cal: 17 mg/dL (ref 5–40)

## 2018-07-07 ENCOUNTER — Ambulatory Visit: Payer: BLUE CROSS/BLUE SHIELD | Admitting: Registered"

## 2018-09-21 ENCOUNTER — Encounter: Payer: Self-pay | Admitting: Family Medicine

## 2018-09-23 ENCOUNTER — Ambulatory Visit (INDEPENDENT_AMBULATORY_CARE_PROVIDER_SITE_OTHER): Payer: Self-pay | Admitting: Family Medicine

## 2018-09-23 ENCOUNTER — Other Ambulatory Visit: Payer: Self-pay

## 2018-09-23 VITALS — BP 124/70 | HR 71 | Wt >= 6400 oz

## 2018-09-23 DIAGNOSIS — R200 Anesthesia of skin: Secondary | ICD-10-CM | POA: Insufficient documentation

## 2018-09-23 DIAGNOSIS — E119 Type 2 diabetes mellitus without complications: Secondary | ICD-10-CM

## 2018-09-23 LAB — POCT GLYCOSYLATED HEMOGLOBIN (HGB A1C): HbA1c, POC (controlled diabetic range): 6.8 % (ref 0.0–7.0)

## 2018-09-23 MED ORDER — GABAPENTIN 300 MG PO CAPS: 300.0000 mg | ORAL_CAPSULE | Freq: Three times a day (TID) | ORAL | 3 refills | Status: DC | PRN

## 2018-09-23 NOTE — Assessment & Plan Note (Addendum)
Acute on chronic.  Likely due to pinched nerve due to distribution and numbness mixed with sharp pain.  ABIs are 1.28 bilaterally, which is reassuring.  DVT highly unlikely due to chronic nature of patient's left leg swelling and no risk factors for DVT, including prolonged immobilization, endothelial dysfunction, or malignancy.  Will obtain lumbar spine x-rays and try gabapentin 300 mg as needed 3 times daily.  Patient was instructed to try this first at night since it can make him drowsy, but he was also told that we can titrate up on the dose of this medication if needed.

## 2018-09-23 NOTE — Progress Notes (Signed)
Subjective:    Anthony Collier - 43 y.o. male MRN [id number]  Date of birth: 14-Aug-1975  CC:  Parth A Omalley is here for left leg numbness and to follow-up on his diabetes.  HPI: Left leg numbness Has been going on for several years, but has worsened over the last few months.  He has numbness in the left lateral thigh that radiates down to his foot.  This is sometimes accompanied by sharp and aching pain.  This is worse at his current job that involves a lot of standing.  It also hurts during the night.  He denies saddle anesthesia, incontinence.  He has also had chronic swelling that is worse in the left leg for many years.  It is worse at the end of the day, and has been worse since starting a job where he stands full-time.  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  Type 2 diabetes Continues to take glipizide most days, and he occasionally takes metformin since this has given him diarrhea in the past.  He is continuing to struggle with changing his diet and with incorporating any exercise.  Health Maintenance:  Health Maintenance Due  Topic Date Due  . OPHTHALMOLOGY EXAM  10/10/1987  . FOOT EXAM  06/12/2018  . URINE MICROALBUMIN  06/12/2018  . INFLUENZA VACCINE  08/27/2018    -  reports that he has never smoked. He has never used smokeless tobacco. - Review of Systems: Per HPI. - Past Medical History: Patient Active Problem List   Diagnosis Date Noted  . Numbness of left lower extremity 09/23/2018  . Obesity, morbid ([place]) 09/17/2017  . Elevated BP without diagnosis of hypertension 06/11/2017  . Diabetes mellitus ([place]) 03/31/2017   - Medications: reviewed and updated   Objective:   Physical Exam BP 124/70   Pulse 71   Wt (!) 411 lb 6.4 oz (186.6 kg)   SpO2 97%   BMI 52.82 kg/m  Gen: NAD, alert, cooperative with exam,  well-appearing HEENT: NCAT, PERRL, clear conjunctiva, oropharynx clear, supple neck CV: RRR, good S1/S2, no murmur, no edema, capillary refill brisk  Extremities: Left leg appears larger than right, although patient says it is chronically larger.  Normal sensation of left thigh and leg.  Patellar and Achilles reflexes 1+ bilaterally.  5/5 strength in hip flexion and leg extension. Psych: good insight, alert and oriented        Assessment & Plan:   Numbness of left lower extremity Acute on chronic.  Likely due to pinched nerve due to distribution and numbness mixed with sharp pain.  ABIs are 1.28 bilaterally, which is reassuring.  DVT highly unlikely due to chronic nature of patient's left leg swelling and no risk factors for DVT, including prolonged immobilization, endothelial dysfunction, or malignancy.  Will obtain lumbar spine x-rays and try gabapentin 300 mg as needed 3 times daily.  Patient was instructed to try this first at night since it can make him drowsy, but he was also told that we can titrate up on the dose of this medication if needed.  Diabetes mellitus ([place]) Well-controlled on glipizide and occasional metformin with A1c of 6.8 today.  Patient was counseled on the importance of diet and exercise for weight loss.  Will continue to monitor every 3 to 6 months.  Lezlie OctaveAmanda , M.D. 09/23/2018, 3:06 PM PGY-3, [facility]

## 2018-09-23 NOTE — Assessment & Plan Note (Signed)
Well-controlled on glipizide and occasional metformin with A1c of 6.8 today.  Patient was counseled on the importance of diet and exercise for weight loss.  Will continue to monitor every 3 to 6 months.

## 2018-09-23 NOTE — Patient Instructions (Addendum)
It was nice meeting you today [name]!  Your left leg numbness is likely due to a pinched nerve, possibly due to some arthritis in your lower back.  We will get x-rays to look at this area at [address]  You can drop in any time between 9 and 5 Monday through Friday.  We will also try gabapentin 300 mg.  Start this at night since it can make you drowsy.  We can slowly titrate up on this medication if needed to a total of 3600 mg/day.  Your swelling will improve if you wear compression stockings up to your knees and have a job that will allow you to sit and walk more.  Please let us know if these interventions are not helpful for you.  I am also including suggestions to improve your diet, since weight loss will benefit you in many ways, including with your right leg numbness.  If you have any questions or concerns, please feel free to call the clinic.   Be well,  Dr. Frances FurbishWinfrey  DASH Eating Plan DASH stands for "Dietary Approaches to Stop Hypertension." The DASH eating plan is a healthy eating plan that has been shown to reduce high blood pressure (hypertension). It may also reduce your risk for type 2 diabetes, heart disease, and stroke. The DASH eating plan may also help with weight loss. What are tips for following this plan?  General guidelines  Avoid eating more than 2,300 mg (milligrams) of salt (sodium) a day. If you have hypertension, you may need to reduce your sodium intake to 1,500 mg a day.  Limit alcohol intake to no more than 1 drink a day for nonpregnant women and 2 drinks a day for men. One drink equals 12 oz of beer, 5 oz of wine, or 1 oz of hard liquor.  Work with your health care provider to maintain a healthy body weight or to lose weight. Ask what an ideal weight is for you.  Get at least 30 minutes of exercise that causes your heart to beat faster (aerobic exercise) most days of the week. Activities may include walking, swimming, or biking.  Work with your  health care provider or diet and nutrition specialist (dietitian) to adjust your eating plan to your individual calorie needs. Reading food labels   Check food labels for the amount of sodium per serving. Choose foods with less than 5 percent of the Daily Value of sodium. Generally, foods with less than 300 mg of sodium per serving fit into this eating plan.  To find whole grains, look for the word "whole" as the first word in the ingredient list. Shopping  Buy products labeled as "low-sodium" or "no salt added."  Buy fresh foods. Avoid canned foods and premade or frozen meals. Cooking  Avoid adding salt when cooking. Use salt-free seasonings or herbs instead of table salt or sea salt. Check with your health care provider or pharmacist before using salt substitutes.  Do not fry foods. Cook foods using healthy methods such as baking, boiling, grilling, and broiling instead.  Cook with heart-healthy oils, such as olive, canola, soybean, or sunflower oil. Meal planning  Eat a balanced diet that includes: ? 5 or more servings of fruits and vegetables each day. At each meal, try to fill half of your plate with fruits and vegetables. ? Up to 6-8 servings of whole grains each day. ? Less than 6 oz of lean meat, poultry, or fish each day. A 3-oz serving  of meat is about the same size as a deck of cards. One egg equals 1 oz. ? 2 servings of low-fat dairy each day. ? A serving of nuts, seeds, or beans 5 times each week. ? Heart-healthy fats. Healthy fats called Omega-3 fatty acids are found in foods such as flaxseeds and coldwater fish, like sardines, salmon, and mackerel.  Limit how much you eat of the following: ? Canned or prepackaged foods. ? Food that is high in trans fat, such as fried foods. ? Food that is high in saturated fat, such as fatty meat. ? Sweets, desserts, sugary drinks, and other foods with added sugar. ? Full-fat dairy products.  Do not salt foods before eating.  Try  to eat at least 2 vegetarian meals each week.  Eat more home-cooked food and less restaurant, buffet, and fast food.  When eating at a restaurant, ask that your food be prepared with less salt or no salt, if possible. What foods are recommended? The items listed may not be a complete list. Talk with your dietitian about what dietary choices are best for you. Grains Whole-grain or whole-wheat bread. Whole-grain or whole-wheat pasta. Brown rice. Orpah Cobb. Bulgur. Whole-grain and low-sodium cereals. Pita bread. Low-fat, low-sodium crackers. Whole-wheat flour tortillas. Vegetables Fresh or frozen vegetables (raw, steamed, roasted, or grilled). Low-sodium or reduced-sodium tomato and vegetable juice. Low-sodium or reduced-sodium tomato sauce and tomato paste. Low-sodium or reduced-sodium canned vegetables. Fruits All fresh, dried, or frozen fruit. Canned fruit in natural juice (without added sugar). Meat and other protein foods Skinless chicken or [place]. Ground chicken or [place]. Pork with fat trimmed off. Fish and seafood. Egg whites. Dried beans, peas, or lentils. Unsalted nuts, nut butters, and seeds. Unsalted canned beans. Lean cuts of beef with fat trimmed off. Low-sodium, lean deli meat. Dairy Low-fat (1%) or fat-free (skim) milk. Fat-free, low-fat, or reduced-fat cheeses. Nonfat, low-sodium ricotta or cottage cheese. Low-fat or nonfat yogurt. Low-fat, low-sodium cheese. Fats and oils Soft margarine without trans fats. Vegetable oil. Low-fat, reduced-fat, or light mayonnaise and salad dressings (reduced-sodium). Canola, safflower, olive, soybean, and sunflower oils. Avocado. Seasoning and other foods Herbs. Spices. Seasoning mixes without salt. Unsalted popcorn and pretzels. Fat-free sweets. What foods are not recommended? The items listed may not be a complete list. Talk with your dietitian about what dietary choices are best for you. Grains Baked goods made with fat, such as  croissants, muffins, or some breads. Dry pasta or rice meal packs. Vegetables Creamed or fried vegetables. Vegetables in a cheese sauce. Regular canned vegetables (not low-sodium or reduced-sodium). Regular canned tomato sauce and paste (not low-sodium or reduced-sodium). Regular tomato and vegetable juice (not low-sodium or reduced-sodium). Rosita Fire. Olives. Fruits Canned fruit in a light or heavy syrup. Fried fruit. Fruit in cream or butter sauce. Meat and other protein foods Fatty cuts of meat. Ribs. Fried meat. Tomasa Blase. Sausage. Bologna and other processed lunch meats. Salami. Fatback. Hotdogs. Bratwurst. Salted nuts and seeds. Canned beans with added salt. Canned or smoked fish. Whole eggs or egg yolks. Chicken or [place] with skin. Dairy Whole or 2% milk, cream, and half-and-half. Whole or full-fat cream cheese. Whole-fat or sweetened yogurt. Full-fat cheese. Nondairy creamers. Whipped toppings. Processed cheese and cheese spreads. Fats and oils Butter. Stick margarine. Lard. Shortening. Ghee. Bacon fat. Tropical oils, such as coconut, palm kernel, or palm oil. Seasoning and other foods Salted popcorn and pretzels. Onion salt, garlic salt, seasoned salt, table salt, and sea salt. Worcestershire sauce. Tartar sauce. Barbecue sauce.  Teriyaki sauce. Soy sauce, including reduced-sodium. Steak sauce. Canned and packaged gravies. Fish sauce. Oyster sauce. Cocktail sauce. Horseradish that you find on the shelf. Ketchup. Mustard. Meat flavorings and tenderizers. Bouillon cubes. Hot sauce and Tabasco sauce. Premade or packaged marinades. Premade or packaged taco seasonings. Relishes. Regular salad dressings. Where to find more information:  National Heart, Lung, and [place]: https://wilson-eaton.com/  American Heart Association: www.heart.org Summary  The DASH eating plan is a healthy eating plan that has been shown to reduce high blood pressure (hypertension). It may also reduce your risk for type 2  diabetes, heart disease, and stroke.  With the DASH eating plan, you should limit salt (sodium) intake to 2,300 mg a day. If you have hypertension, you may need to reduce your sodium intake to 1,500 mg a day.  When on the DASH eating plan, aim to eat more fresh fruits and vegetables, whole grains, lean proteins, low-fat dairy, and heart-healthy fats.  Work with your health care provider or diet and nutrition specialist (dietitian) to adjust your eating plan to your individual calorie needs. This information is not intended to replace advice given to you by your health care provider. Make sure you discuss any questions you have with your health care provider. Document Released: 01/01/2011 Document Revised: 12/25/2016 Document Reviewed: 01/06/2016 Elsevier Patient Education  2020 [employer].

## 2018-09-26 ENCOUNTER — Other Ambulatory Visit: Payer: Self-pay

## 2018-09-26 ENCOUNTER — Ambulatory Visit
Admission: RE | Admit: 2018-09-26 | Discharge: 2018-09-26 | Disposition: A | Discharge status: Home / Self Care | Payer: BC Managed Care – PPO | Source: Ambulatory Visit | Attending: Family Medicine | Admitting: Family Medicine

## 2018-09-26 DIAGNOSIS — R200 Anesthesia of skin: Secondary | ICD-10-CM

## 2018-10-05 ENCOUNTER — Encounter: Payer: Self-pay | Admitting: Family Medicine

## 2018-10-17 IMAGING — CR DG CHEST 2V
2 series · 2 of 2 positions shown · non-contrast
Comparison: None.

CLINICAL DATA: 41-year-old male with generalized chest pain for 2
weeks. Symptoms increase in the mornings, feel like spasms.
Sometimes postprandial.

EXAM:
CHEST - 2 VIEW

[chest pa]
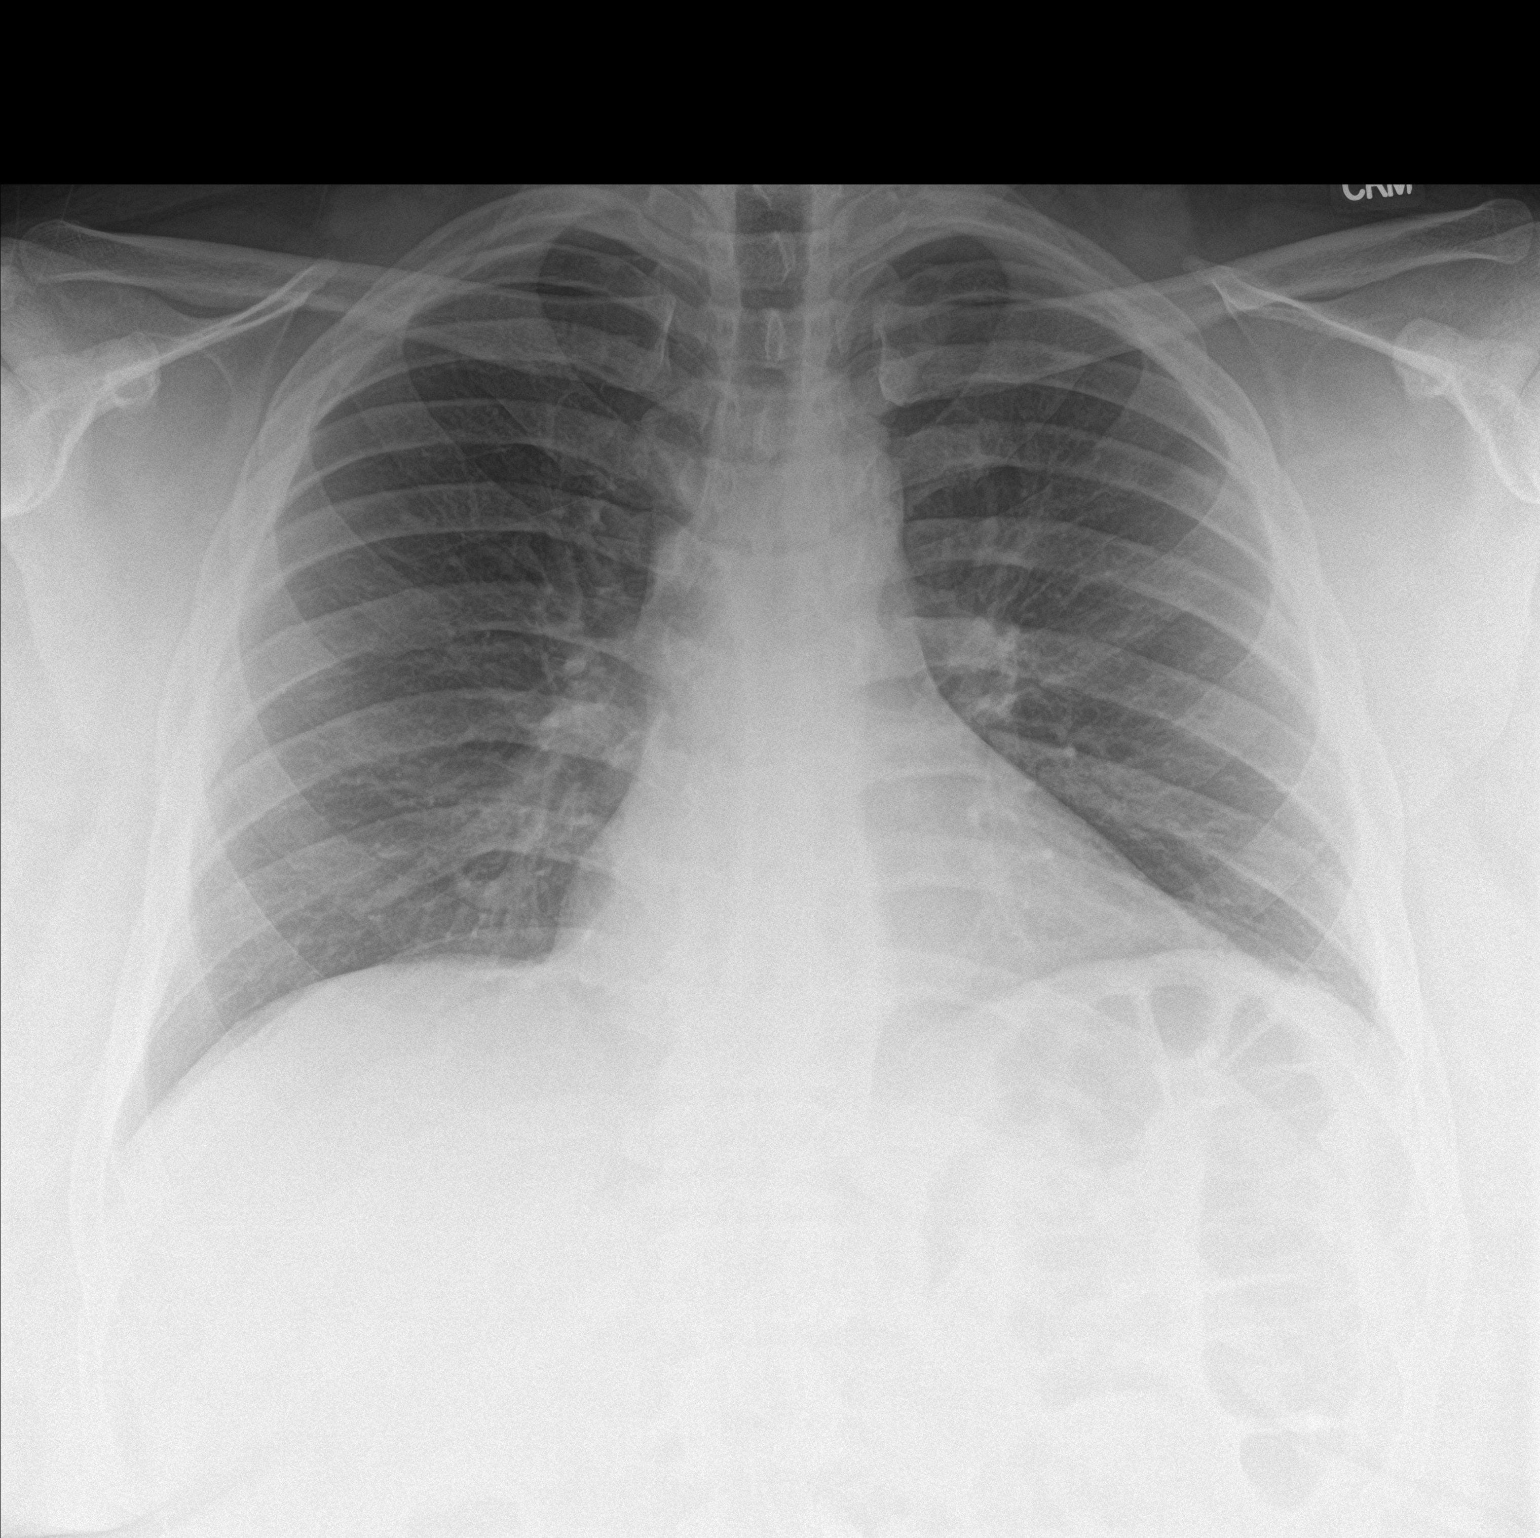

[chest lat]
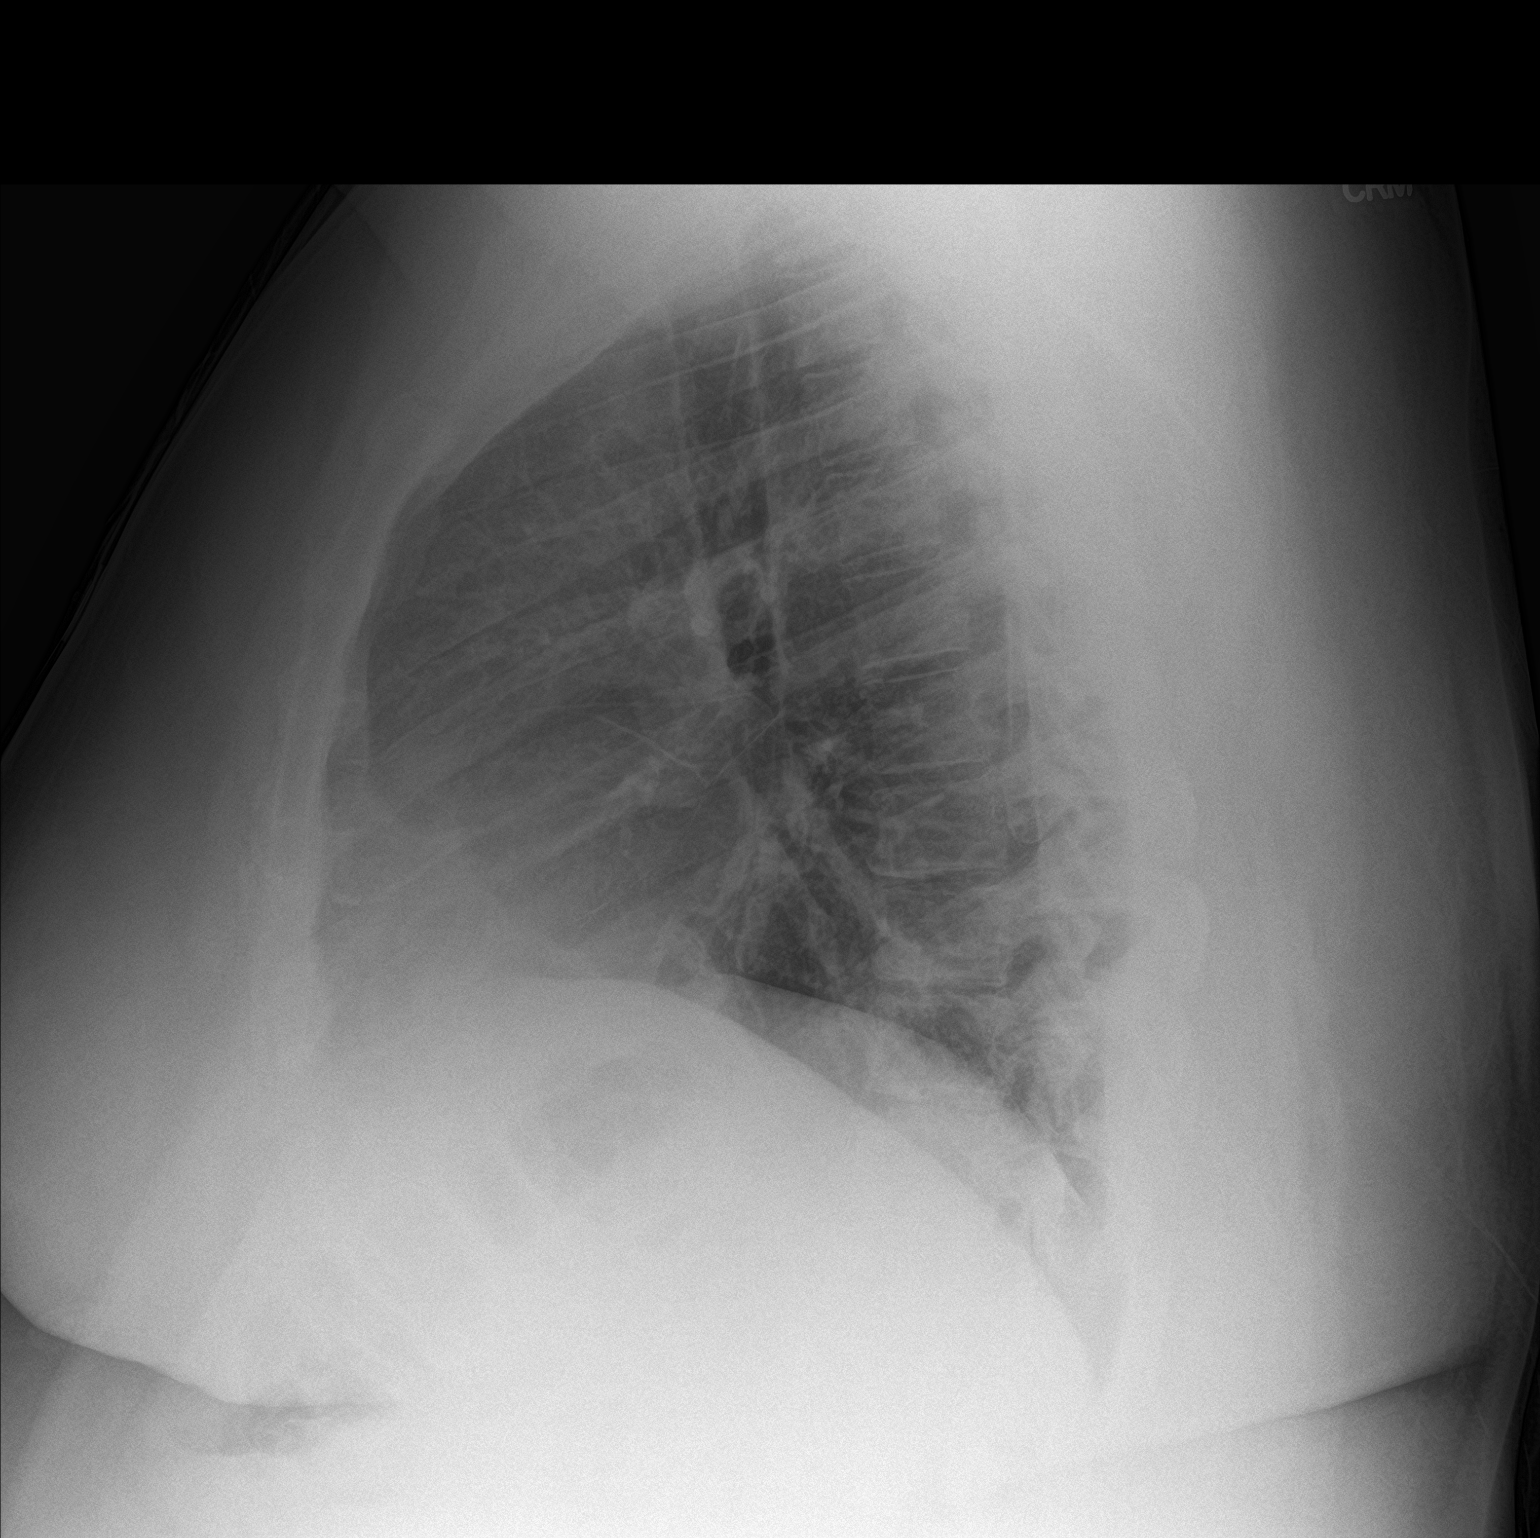

[2 of 2 positions shown; findings below may reference images not displayed]

FINDINGS: Large body habitus with low normal lung volumes. Normal cardiac size
and mediastinal contours. Visualized tracheal air column is within
normal limits. Both lungs appear clear. No pneumothorax or pleural
effusion. Negative visible bowel gas pattern. No osseous abnormality
identified.
IMPRESSION: Negative.  No acute cardiopulmonary abnormality.

## 2019-10-08 ENCOUNTER — Encounter (HOSPITAL_COMMUNITY): Payer: Self-pay

## 2019-10-08 ENCOUNTER — Other Ambulatory Visit: Payer: Self-pay

## 2019-10-08 ENCOUNTER — Ambulatory Visit (HOSPITAL_COMMUNITY)
Admission: EM | Admit: 2019-10-08 | Discharge: 2019-10-08 | Disposition: A | Discharge status: Home / Self Care | Payer: BC Managed Care – PPO | Attending: Urgent Care | Admitting: Urgent Care

## 2019-10-08 DIAGNOSIS — E1165 Type 2 diabetes mellitus with hyperglycemia: Secondary | ICD-10-CM

## 2019-10-08 DIAGNOSIS — M79645 Pain in left finger(s): Secondary | ICD-10-CM

## 2019-10-08 DIAGNOSIS — L03012 Cellulitis of left finger: Secondary | ICD-10-CM

## 2019-10-08 LAB — CBG MONITORING, ED: Glucose-Capillary: 114 mg/dL — ABNORMAL HIGH (ref 70–99)

## 2019-10-08 MED ORDER — LIDOCAINE HCL 2 % IJ SOLN
INTRAMUSCULAR | Status: AC
  Filled 2019-10-08: qty 20

## 2019-10-08 MED ORDER — DOXYCYCLINE HYCLATE 100 MG PO CAPS: 100.0000 mg | ORAL_CAPSULE | Freq: Two times a day (BID) | ORAL | 0 refills | Status: DC

## 2019-10-08 NOTE — ED Provider Notes (Signed)
[facility]   MRN: [id number] DOB: [birth year]  Subjective:   Anthony Collier is a 44 y.o. male presenting for 1 week history of suffering a left middle finger injury.  Patient states that since then it has gotten swollen and painful, sees a green spot right Now.  Patient is an uncontrolled diabetic.  States that he has not taken his medications for months.  Was lost to follow-up and lost his insurance.  Would like to get some recommendations for PCP today.  Denies fever, redness, red streaks along his hand.  No current facility-administered medications for this encounter.  Current Outpatient Medications:  .  gabapentin (NEURONTIN) 300 MG capsule, Take 1 capsule (300 mg total) by mouth 3 (three) times daily as needed., Disp: 90 capsule, Rfl: 3 .  glipiZIDE (GLUCOTROL) 5 MG tablet, Take 1 tablet (5 mg total) by mouth daily., Disp: 90 tablet, Rfl: 3 .  meloxicam (MOBIC) 15 MG tablet, Take 1 tablet (15 mg total) by mouth daily., Disp: 30 tablet, Rfl: 0 .  metFORMIN (GLUCOPHAGE) 1000 MG tablet, Take 1 tablet (1,000 mg total) by mouth 2 (two) times daily with a meal., Disp: 180 tablet, Rfl: 3   No Known Allergies  Past Medical History:  Diagnosis Date  . Diabetes mellitus without complication (HCC)   . Hypertension      History reviewed. No pertinent surgical history.  Family History  Problem Relation Age of Onset  . Kidney failure Father   . Aneurysm Father   . Hypertension Other   . Healthy Mother     Social History   Tobacco Use  . Smoking status: Never Smoker  . Smokeless tobacco: Never Used  Substance Use Topics  . Alcohol use: No  . Drug use: No    ROS   Objective:   Vitals: BP 128/60   Pulse 64   Temp 98.3 F (36.8 C)   Resp 19   SpO2 100%    Physical Exam Constitutional:      General: He is not in acute distress.    Appearance: Normal appearance. He is well-developed. He is obese. He is not ill-appearing, toxic-appearing or  diaphoretic.  HENT:     Head: Normocephalic and atraumatic.     Right Ear: External ear normal.     Left Ear: External ear normal.     Nose: Nose normal.     Mouth/Throat:     Pharynx: Oropharynx is clear.  Eyes:     General: No scleral icterus.       Right eye: No discharge.        Left eye: No discharge.     Extraocular Movements: Extraocular movements intact.     Pupils: Pupils are equal, round, and reactive to light.  Cardiovascular:     Rate and Rhythm: Normal rate.  Pulmonary:     Effort: Pulmonary effort is normal.  Musculoskeletal:       Hands:     Cervical back: Normal range of motion.  Neurological:     Mental Status: He is alert and oriented to person, place, and time.  Psychiatric:        Mood and Affect: Mood normal.        Behavior: Behavior normal.        Thought Content: Thought content normal.        Judgment: Judgment normal.     Results for orders placed or performed during the hospital encounter of 10/08/19 (from the past  24 hour(s))  POC CBG monitoring     Status: Abnormal   Collection Time: 10/08/19 12:09 PM  Result Value Ref Range   Glucose-Capillary 114 (H) 70 - 99 mg/dL    PROCEDURE NOTE: I&D of Abscess Verbal consent obtained. Local anesthesia with 1cc of 2% lidocaine without lidocaine. Site cleansed with Betadine.  Paronychia expressed using Adson forcep, discharge 1 cc mixture of purulence, serosanguineous fluid. Cleansed and dressed.   Assessment and Plan :   PDMP not reviewed this encounter.  1. Pain of left middle finger   2. Paronychia of finger of left hand   3. Uncontrolled type 2 diabetes mellitus with hyperglycemia (HCC)     Paronychia successfully treated with incision and drainage.  Recommended starting doxycycline, Tylenol for pain.  Patient's blood sugar is 114 today.  Recommended continued diabetic friendly diet.  Follow-up with new PCP, information provided to the patient. Counseled patient on potential for adverse effects  with medications prescribed/recommended today, ER and return-to-clinic precautions discussed, patient verbalized understanding.    Wallis Bamberg, PA-C 10/08/19 1236

## 2019-10-08 NOTE — Discharge Instructions (Addendum)
Please change your dressing 3-5 times daily. Do not apply any ointments or creams. Each time you change your dressing, make sure that you are pressing on the wound to get pus to come out.  Try your best to have a family member help you clean gently around the perimeter of the wound with gentle soap and warm water. Pat your wound dry and let it air out if possible to make sure it is dry before reapplying another dressing.    Do not use any nonsteroidal anti-inflammatories (NSAIDs) like ibuprofen, Motrin, naproxen, Aleve, etc. which are all available over-the-counter.  Please just use Tylenol at a dose of 500mg-650mg once every 6 hours as needed for your aches, pains, fevers.  

## 2019-10-08 NOTE — ED Triage Notes (Addendum)
Pt presents with complaints of hitting his finger on wood at work a week ago. Area around the patients left middle finger is green in color. Pt has full range of motion with finger. Reports it feels swollen and tight.   Pt is type 2 diabetic and has not taken his medications in 6 months. He has not checked his blood sugar recently.

## 2020-03-14 ENCOUNTER — Other Ambulatory Visit: Payer: Self-pay | Admitting: Family Medicine

## 2020-03-14 ENCOUNTER — Other Ambulatory Visit: Payer: Self-pay

## 2020-03-14 ENCOUNTER — Encounter: Payer: Self-pay | Admitting: Family Medicine

## 2020-03-14 ENCOUNTER — Ambulatory Visit (INDEPENDENT_AMBULATORY_CARE_PROVIDER_SITE_OTHER): Payer: 59 | Admitting: Family Medicine

## 2020-03-14 VITALS — BP 120/62 | HR 77 | Ht 74.0 in | Wt 396.0 lb

## 2020-03-14 DIAGNOSIS — I270 Primary pulmonary hypertension: Secondary | ICD-10-CM

## 2020-03-14 DIAGNOSIS — I152 Hypertension secondary to endocrine disorders: Secondary | ICD-10-CM

## 2020-03-14 DIAGNOSIS — E119 Type 2 diabetes mellitus without complications: Secondary | ICD-10-CM | POA: Diagnosis not present

## 2020-03-14 DIAGNOSIS — E1159 Type 2 diabetes mellitus with other circulatory complications: Secondary | ICD-10-CM

## 2020-03-14 DIAGNOSIS — Z23 Encounter for immunization: Secondary | ICD-10-CM

## 2020-03-14 DIAGNOSIS — I10 Essential (primary) hypertension: Secondary | ICD-10-CM

## 2020-03-14 DIAGNOSIS — Z1159 Encounter for screening for other viral diseases: Secondary | ICD-10-CM

## 2020-03-14 LAB — POCT GLYCOSYLATED HEMOGLOBIN (HGB A1C): Hemoglobin A1C: 7.3 % — AB (ref 4.0–5.6)

## 2020-03-14 NOTE — Assessment & Plan Note (Signed)
-  BP 120/62 in the office -extensive diet and exercise counseling provided -no need for medication therapy at this time -encouraged wearing compression stockings at work and elevating legs when home

## 2020-03-14 NOTE — Progress Notes (Signed)
    SUBJECTIVE:   CHIEF COMPLAINT / HPI:   Patient presents to the clinic for an annual wellness check. Denies any concerns today.   PERTINENT  PMH / PSH:   Type 2 DM Patient reports that he was previously prescribed metformin but experienced GI discomfort and diarrhea so he stopped taking it. At that time he was instead prescribed glipizide which he reports non-compliance to because he just does want to take any medications. Most recent A1c 6.8, per chart review. Usually sees the ophthalmologist annually but has yet to see them this year. Denies any known hypoglycemic episodes and has not recently experienced dizziness or lightheadedness. 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 Drinks about a gallon of soda per day and does not eat vegetables and fruits often, instead eats a significant amount of fried foods.   Hypertension Patient has prior history of hypertension. He does not check his BP regularly at home. Denies chest pain, dyspnea and palpitations. Endorses leg swelling on left leg more often. No prescribed anti-hypertensives noted. Patient does not take any medications at home.   OBJECTIVE:   BP 120/62   Pulse 77   Ht 6\' 2"  (1.88 m)   Wt (!) 396 lb (179.6 kg)   SpO2 96%   BMI 50.84 kg/m   General: Patient well-appearing, in no acute distress. CV: RRR, no murmurs or gallops auscultated Resp: lungs clear to auscultation bilaterally, breathing comfortably on room air Abdomen: soft, nontender, presence of active bowel sounds Ext: radial pulses strong and equal bilaterally, 1+ non-pitting edema noted bilaterally  Neuro: normal gait Psych: mood appropriate   ASSESSMENT/PLAN:   Diabetes mellitus (HCC) -A1c 7.3, will call patient and discuss -pending CMP, urine microalbumin -patient to schedule ophthalmology appointment soon  -extensive diet and exercise counseling, stressed importance  of incorporating vegetables into diet and working to cut down soda intake significantly, patient understands and agrees  -consider starting statin at next visit -consider metformin XR to mitigate GI side effects -foot exam deferred until follow up visit  -f/u in 1 month for further diabetes management, consider changes above and possible initiation of diabetic regimen  Hypertension  -BP 120/62 in the office -extensive diet and exercise counseling provided -no need for medication therapy at this time -encouraged wearing compression stockings at work and elevating legs when home   Influenza vaccine administered. Pending Hep C screening, lipid panel and CBC.  , DO [place] [facility]

## 2020-03-14 NOTE — Patient Instructions (Signed)
It was great seeing you today!  Today we discussed eating more healthy and trying to exercise more regularly. Even if it is just 10-15 minutes of walking, that will significantly improve your health. Please try to cut down on soda gradually and try to incorporate many fruits and vegetables into your diet. Please try to wear compression socks when you are at work and try to elevate your legs when resting at home.   I will contact you regarding any abnormal lab results.   Please follow up in 1 month, if anything arises between now and then, please don't hesitate to contact our office.   Thank you for allowing [place] to be a part of your medical care!  Thank you, Dr. Robyne Peers   Diabetes Mellitus and Nutrition, Adult When you have diabetes, or diabetes mellitus, it is very important to have healthy eating habits because your blood sugar (glucose) levels are greatly affected by what you eat and drink. Eating healthy foods in the right amounts, at about the same times every day, can help you:  Control your blood glucose.  Lower your risk of heart disease.  Improve your blood pressure.  Reach or maintain a healthy weight. What can affect my meal plan? Every person with diabetes is different, and each person has different needs for a meal plan. Your health care provider may recommend that you work with a dietitian to make a meal plan that is best for you. Your meal plan may vary depending on factors such as:  The calories you need.  The medicines you take.  Your weight.  Your blood glucose, blood pressure, and cholesterol levels.  Your activity level.  Other health conditions you have, such as heart or kidney disease. How do carbohydrates affect me? Carbohydrates, also called carbs, affect your blood glucose level more than any other type of food. Eating carbs naturally raises the amount of glucose in your blood. Carb counting is a method for keeping track of how many carbs you eat. Counting  carbs is important to keep your blood glucose at a healthy level, especially if you use insulin or take certain oral diabetes medicines. It is important to know how many carbs you can safely have in each meal. This is different for every person. Your dietitian can help you calculate how many carbs you should have at each meal and for each snack. How does alcohol affect me? Alcohol can cause a sudden decrease in blood glucose (hypoglycemia), especially if you use insulin or take certain oral diabetes medicines. Hypoglycemia can be a life-threatening condition. Symptoms of hypoglycemia, such as sleepiness, dizziness, and confusion, are similar to symptoms of having too much alcohol.  Do not drink alcohol if: ? Your health care provider tells you not to drink. ? You are pregnant, may be pregnant, or are planning to become pregnant.  If you drink alcohol: ? Do not drink on an empty stomach. ? Limit how much you use to:  0-1 drink a day for women.  0-2 drinks a day for men. ? Be aware of how much alcohol is in your drink. In the [place] one drink equals one 12 oz bottle of beer (355 mL), one 5 oz glass of wine (148 mL), or one 1 oz glass of hard liquor (44 mL). ? Keep yourself hydrated with water, diet soda, or unsweetened iced tea.  Keep in mind that regular soda, juice, and other mixers may contain a lot of sugar and must be counted as carbs.  What are tips for following this plan? Reading food labels  Start by checking the serving size on the "Nutrition Facts" label of packaged foods and drinks. The amount of calories, carbs, fats, and other nutrients listed on the label is based on one serving of the item. Many items contain more than one serving per package.  Check the total grams (g) of carbs in one serving. You can calculate the number of servings of carbs in one serving by dividing the total carbs by 15. For example, if a food has 30 g of total carbs per serving, it would be equal to 2  servings of carbs.  Check the number of grams (g) of saturated fats and trans fats in one serving. Choose foods that have a low amount or none of these fats.  Check the number of milligrams (mg) of salt (sodium) in one serving. Most people should limit total sodium intake to less than 2,300 mg per day.  Always check the nutrition information of foods labeled as "low-fat" or "nonfat." These foods may be higher in added sugar or refined carbs and should be avoided.  Talk to your dietitian to identify your daily goals for nutrients listed on the label. Shopping  Avoid buying canned, pre-made, or processed foods. These foods tend to be high in fat, sodium, and added sugar.  Shop around the outside edge of the grocery store. This is where you will most often find fresh fruits and vegetables, bulk grains, fresh meats, and fresh dairy. Cooking  Use low-heat cooking methods, such as baking, instead of high-heat cooking methods like deep frying.  Cook using healthy oils, such as olive, canola, or sunflower oil.  Avoid cooking with butter, cream, or high-fat meats. Meal planning  Eat meals and snacks regularly, preferably at the same times every day. Avoid going long periods of time without eating.  Eat foods that are high in fiber, such as fresh fruits, vegetables, beans, and whole grains. Talk with your dietitian about how many servings of carbs you can eat at each meal.  Eat 4-6 oz (112-168 g) of lean protein each day, such as lean meat, chicken, fish, eggs, or tofu. One ounce (oz) of lean protein is equal to: ? 1 oz (28 g) of meat, chicken, or fish. ? 1 egg. ?  cup (62 g) of tofu.  Eat some foods each day that contain healthy fats, such as avocado, nuts, seeds, and fish.   What foods should I eat? Fruits Berries. Apples. Oranges. Peaches. Apricots. Plums. Grapes. Mango. Papaya. Pomegranate. Kiwi. Cherries. Vegetables Lettuce. Spinach. Leafy greens, including kale, chard, collard  greens, and mustard greens. Beets. Cauliflower. Cabbage. Broccoli. Carrots. Green beans. Tomatoes. Peppers. Onions. Cucumbers. Brussels sprouts. Grains Whole grains, such as whole-wheat or whole-grain bread, crackers, tortillas, cereal, and pasta. Unsweetened oatmeal. Quinoa. Brown or wild rice. Meats and other proteins Seafood. Poultry without skin. Lean cuts of poultry and beef. Tofu. Nuts. Seeds. Dairy Low-fat or fat-free dairy products such as milk, yogurt, and cheese. The items listed above may not be a complete list of foods and beverages you can eat. Contact a dietitian for more information. What foods should I avoid? Fruits Fruits canned with syrup. Vegetables Canned vegetables. Frozen vegetables with butter or cream sauce. Grains Refined white flour and flour products such as bread, pasta, snack foods, and cereals. Avoid all processed foods. Meats and other proteins Fatty cuts of meat. Poultry with skin. Breaded or fried meats. Processed meat. Avoid saturated fats. Dairy [employer]  yogurt, cheese, or milk. Beverages Sweetened drinks, such as soda or iced tea. The items listed above may not be a complete list of foods and beverages you should avoid. Contact a dietitian for more information. Questions to ask a health care provider  Do I need to meet with a diabetes educator?  Do I need to meet with a dietitian?  What number can I call if I have questions?  When are the best times to check my blood glucose? Where to find more information:  American Diabetes Association: diabetes.org  Academy of Nutrition and Dietetics: www.eatright.[employer] of Diabetes and Digestive and Kidney Diseases: CarFlippers.tn  Association of Diabetes Care and Education Specialists: www.diabeteseducator.org Summary  It is important to have healthy eating habits because your blood sugar (glucose) levels are greatly affected by what you eat and drink.  A healthy meal plan will  help you control your blood glucose and maintain a healthy lifestyle.  Your health care provider may recommend that you work with a dietitian to make a meal plan that is best for you.  Keep in mind that carbohydrates (carbs) and alcohol have immediate effects on your blood glucose levels. It is important to count carbs and to use alcohol carefully. This information is not intended to replace advice given to you by your health care provider. Make sure you discuss any questions you have with your health care provider. Document Revised: 12/20/2018 Document Reviewed: 12/20/2018 Elsevier Patient Education  2021 [employer].

## 2020-03-14 NOTE — Assessment & Plan Note (Addendum)
-  A1c 7.3, will call patient and discuss -pending CMP, urine microalbumin -patient to schedule ophthalmology appointment soon  -extensive diet and exercise counseling, stressed importance of incorporating vegetables into diet and working to cut down soda intake significantly, patient understands and agrees  -consider starting statin at next visit -consider metformin XR to mitigate GI side effects -foot exam deferred until follow up visit  -f/u in 1 month for further diabetes management, consider changes above and possible initiation of diabetic regimen

## 2020-03-14 NOTE — Assessment & Plan Note (Signed)
-  BP 120/62 in the office -extensive diet and exercise counseling provided -no need for medication therapy at this time -encouraged wearing compression stockings at work and elevating legs when home  

## 2020-03-15 LAB — COMPREHENSIVE METABOLIC PANEL
ALT: 20 IU/L (ref 0–44)
AST: 20 IU/L (ref 0–40)
Albumin/Globulin Ratio: 1.3 (ref 1.2–2.2)
Albumin: 4.1 g/dL (ref 4.0–5.0)
Alkaline Phosphatase: 76 IU/L (ref 44–121)
BUN/Creatinine Ratio: 10 (ref 9–20)
BUN: 9 mg/dL (ref 6–24)
Bilirubin Total: 0.2 mg/dL (ref 0.0–1.2)
CO2: 24 mmol/L (ref 20–29)
Calcium: 9.2 mg/dL (ref 8.7–10.2)
Chloride: 103 mmol/L (ref 96–106)
Creatinine, Ser: 0.9 mg/dL (ref 0.76–1.27)
GFR calc Af Amer: 121 mL/min/{1.73_m2} (ref >59)
GFR calc non Af Amer: 105 mL/min/{1.73_m2} (ref >59)
Globulin, Total: 3.1 g/dL (ref 1.5–4.5)
Glucose: 146 mg/dL — ABNORMAL HIGH (ref 65–99)
Potassium: 4.2 mmol/L (ref 3.5–5.2)
Sodium: 138 mmol/L (ref 134–144)
Total Protein: 7.2 g/dL (ref 6.0–8.5)

## 2020-03-15 LAB — CBC WITH DIFFERENTIAL/PLATELET
Basophils Absolute: 0 10*3/uL (ref 0.0–0.2)
Basos: 1 %
EOS (ABSOLUTE): 0.1 10*3/uL (ref 0.0–0.4)
Eos: 1 %
Hematocrit: 40.6 % (ref 37.5–51.0)
Hemoglobin: 12.7 g/dL — ABNORMAL LOW (ref 13.0–17.7)
Immature Grans (Abs): 0.1 10*3/uL (ref 0.0–0.1)
Immature Granulocytes: 1 %
Lymphocytes Absolute: 1.8 10*3/uL (ref 0.7–3.1)
Lymphs: 29 %
MCH: 26.7 pg (ref 26.6–33.0)
MCHC: 31.3 g/dL — ABNORMAL LOW (ref 31.5–35.7)
MCV: 86 fL (ref 79–97)
Monocytes Absolute: 0.6 10*3/uL (ref 0.1–0.9)
Monocytes: 9 %
Neutrophils Absolute: 3.7 10*3/uL (ref 1.4–7.0)
Neutrophils: 59 %
Platelets: 264 10*3/uL (ref 150–450)
RBC: 4.75 x10E6/uL (ref 4.14–5.80)
RDW: 13.6 % (ref 11.6–15.4)
WBC: 6.3 10*3/uL (ref 3.4–10.8)

## 2020-03-15 LAB — LIPID PANEL
Chol/HDL Ratio: 4.3 ratio (ref 0.0–5.0)
Cholesterol, Total: 167 mg/dL (ref 100–199)
HDL: 39 mg/dL — ABNORMAL LOW (ref >39)
LDL Chol Calc (NIH): 99 mg/dL (ref 0–99)
Triglycerides: 167 mg/dL — ABNORMAL HIGH (ref 0–149)
VLDL Cholesterol Cal: 29 mg/dL (ref 5–40)

## 2020-03-15 LAB — MICROALBUMIN / CREATININE URINE RATIO
Creatinine, Urine: 226.7 mg/dL
Microalb/Creat Ratio: 3 mg/g creat (ref 0–29)
Microalbumin, Urine: 7.7 ug/mL

## 2020-03-15 LAB — HEPATITIS C ANTIBODY: Hep C Virus Ab: <0.1 s/co ratio (ref 0.0–0.9)

## 2020-04-04 ENCOUNTER — Ambulatory Visit (INDEPENDENT_AMBULATORY_CARE_PROVIDER_SITE_OTHER): Payer: 59 | Admitting: Family Medicine

## 2020-04-04 ENCOUNTER — Encounter: Payer: Self-pay | Admitting: Family Medicine

## 2020-04-04 ENCOUNTER — Other Ambulatory Visit: Payer: Self-pay

## 2020-04-04 VITALS — BP 120/60 | HR 62 | Ht 74.0 in | Wt >= 6400 oz

## 2020-04-04 DIAGNOSIS — E119 Type 2 diabetes mellitus without complications: Secondary | ICD-10-CM

## 2020-04-04 DIAGNOSIS — E781 Pure hyperglyceridemia: Secondary | ICD-10-CM

## 2020-04-04 MED ORDER — ATORVASTATIN CALCIUM 10 MG PO TABS: 10.0000 mg | ORAL_TABLET | Freq: Every day | ORAL | 0 refills | Status: DC

## 2020-04-04 NOTE — Progress Notes (Signed)
    SUBJECTIVE:   CHIEF COMPLAINT / HPI:   Type 2 DM Denies dyspnea, chest pain, nausea and vomiting. Denies hypoglycemic episodes or experiencing dizziness. Last A1c 7.3 and blood glucose 146. Currently not on any medication, in the process of making changes to his dietary habits. Previously used to drink 1 gallon of soda a day, after last visit we discussed gradually cutting down. Reports that he is currently only have 1 can (12 ounces) of soda a day at most. He also has an energy drink (16 ounces) about 2 times a week to help him be more alert on days he has to be up earlier for work. Tries to drink the energy drink without sugar. He occasionally drinks sweet tea but started to dilute it with water to lessen the sugar content. Stays very active at his job throughout the day but tries to go on 10-15 minute walks occasionally. He is thinking about joining a gym with his wife. He has cut out a lot of sweets from his diet but still eats fried foods. Eats plenty of fruits and some vegetables daily.     OBJECTIVE:   BP 120/60   Pulse 62   Ht 6\' 2"  (1.88 m)   Wt (!) 417 lb 3.2 oz (189.2 kg)   SpO2 92%   BMI 53.57 kg/m   General: Patient well-appearing, in no acute distress. CV: RRR, no murmurs or gallops auscultated Resp: CTAB, no rales or rhonchi noted Abdomen: soft, nontender, presence of bowel sounds Ext: no LE edema noted bilaterally, radial pulses present bilaterally  Neuro: normal gait Psych: mood appropriate   ASSESSMENT/PLAN:   Diabetes mellitus (HCC) -Patient is really trying to make significant changes to his lifestyle and has been working very hard over a short period of time to achieve better habits. Encouraged to continue lifestyle modifications with added goals discussed during today's visit.  -extensive diet and exercise counseling provided  -due for foot exam, consider at follow up visit  -f/u in 2 months for repeat A1c, consider started metformin at this time     Hypertriglyceridemia -most recent lipid panel notable for triglycerides 167 and HDL 39 -statin therapy initiated today, prescribed atorvastatin 10 mg daily -discussed importance of statin and importance of compliance, patient agreeable and voices understanding -diet and exercise counseling       , DO [facility]

## 2020-04-04 NOTE — Assessment & Plan Note (Signed)
-  most recent lipid panel notable for triglycerides 167 and HDL 39 -statin therapy initiated today, prescribed atorvastatin 10 mg daily -discussed importance of statin and importance of compliance, patient agreeable and voices understanding -diet and exercise counseling

## 2020-04-04 NOTE — Patient Instructions (Signed)
It was great seeing you today!  Congratulations on making so many significant changes! Continue to only try to drink one soda a day at most. Please try to cut down on excess sugar and salt containing foods. Try to incorporate vegetables into your diet and pack some for the road as well so you can have some during the day at work.   I have given a prescription for atorvastatin 10 mg, please take this daily. This will help your lipid levels and is good for overall preventative health.   Please follow up in 2 months for next scheduled appointment, if anything arises between now and then, please don't hesitate to contact our office.   Thank you for allowing [place] to be a part of your medical care!  Thank you, Dr. Robyne Peers   Diet Recommendations for Diabetes  Carbohydrate includes starch, sugar, and fiber.  Of these, only sugar and starch raise blood glucose.  (Fiber is found in fruits, vegetables [especially skin, seeds, and stalks], whole grains, and beans.)   Starchy (carb) foods: Bread, rice, pasta, potatoes, corn, cereal, grits, crackers, bagels, muffins, all baked goods.  (Fruit, milk, and yogurt also have carbohydrate, but most of these foods will not spike your blood sugar as most starchy or sweet foods will.)  A few fruits do cause high blood sugars; use small portions of bananas (limit to 1/2 at a time), grapes, watermelon, and oranges.   Protein foods: Meat, fish, poultry, eggs, dairy foods, and beans such as pinto and kidney beans (beans also provide carbohydrate).   1. Eat at least 3 REAL meals and 1-2 snacks per day. Eat breakfast within the first hour of getting up.  Have something to eat at least every 5 hours while awake.   2. Limit starchy foods to TWO per meal and ONE per snack. ONE portion of a starchy food is equal to the following:   - ONE slice of bread (or its equivalent, such as half of a hamburger bun).   - 1/2 cup of a "scoopable" starchy food such as potatoes or rice.   - 15  grams of Total Carbohydrate as shown on food label.   - Every 4 ounces of a sweet drink (including fruit juice). 3. Include at every lunch and dinner: a protein food, a carb food, and vegetables.   - An ideal lunch or dinner includes twice the volume of veg's as protein or carbohydrate foods.  - Fresh or frozen vegetables are best.   - Keep frozen vegetables on hand for a quick option.

## 2020-04-04 NOTE — Assessment & Plan Note (Addendum)
-  Patient is really trying to make significant changes to his lifestyle and has been working very hard over a short period of time to achieve better habits. Encouraged to continue lifestyle modifications with added goals discussed during today's visit.  -extensive diet and exercise counseling provided  -due for foot exam, consider at follow up visit  -f/u in 2 months for repeat A1c, consider started metformin at this time

## 2020-06-04 ENCOUNTER — Ambulatory Visit: Payer: 59 | Admitting: Family Medicine

## 2020-07-11 ENCOUNTER — Ambulatory Visit: Payer: 59 | Admitting: Family Medicine

## 2020-07-30 ENCOUNTER — Ambulatory Visit (INDEPENDENT_AMBULATORY_CARE_PROVIDER_SITE_OTHER): Payer: 59

## 2020-07-30 ENCOUNTER — Other Ambulatory Visit: Payer: Self-pay

## 2020-07-30 ENCOUNTER — Ambulatory Visit (INDEPENDENT_AMBULATORY_CARE_PROVIDER_SITE_OTHER): Payer: 59 | Admitting: Family Medicine

## 2020-07-30 ENCOUNTER — Encounter: Payer: Self-pay | Admitting: Family Medicine

## 2020-07-30 VITALS — BP 137/81 | HR 65 | Ht 74.0 in | Wt >= 6400 oz

## 2020-07-30 DIAGNOSIS — E119 Type 2 diabetes mellitus without complications: Secondary | ICD-10-CM

## 2020-07-30 DIAGNOSIS — Z23 Encounter for immunization: Secondary | ICD-10-CM | POA: Diagnosis not present

## 2020-07-30 LAB — POCT GLYCOSYLATED HEMOGLOBIN (HGB A1C): HbA1c, POC (controlled diabetic range): 7.2 % — AB (ref 0.0–7.0)

## 2020-07-30 MED ORDER — METFORMIN HCL ER 500 MG PO TB24: 500.0000 mg | ORAL_TABLET | Freq: Two times a day (BID) | ORAL | 3 refills | Status: DC

## 2020-07-30 NOTE — Progress Notes (Signed)
    SUBJECTIVE:   CHIEF COMPLAINT / HPI:   Type 2 DM Currently diet controlled, not on any medications. Last A1c 7.3, plan for repeat testing today. Has cut down significantly on soda intake, now drinking about 2L of soda over the course of a week. Primarily drinks with meals but has been working on drinking more water during the day, keeps a case of water in the trunk of his car. Starting to eat more healthy, eats fruits and vegetables all the time but more often fruits. Exercises regularly, stays active at work all day. Compliant on atorvastatin 10 mg daily. Last eye doctor visit was about a year ago, due for another visit. Was previously on metformin 1000 mg but stopped it due to GI side effects.   OBJECTIVE:   BP 137/81   Pulse 65   Ht 6\' 2"  (1.88 m)   Wt (!) 416 lb (188.7 kg)   SpO2 97%   BMI 53.41 kg/m   General: Patient well-appearing, in no acute distress. CV: RRR, no murmurs or gallops auscultated Resp: CTAB, no wheezing or rales MSK: normal foot exam with gross sensation intact, microfilament testing appropriate, no open sores or rashes noted  Ext: radial and distal pulses strong and equal bilaterally   ASSESSMENT/PLAN:   Diabetes mellitus (HCC) -A1c 7.2, currently not at goal as diet-controlled -start metformin XR 500 mg bid to avoid GI side effects, discussed common side effects -extensive diet and exercise counseling -goal to cut down to 1L of soda a week -f/u in 1 month     Taniyah Ballow , DO [facility]

## 2020-07-30 NOTE — Patient Instructions (Addendum)
It was great seeing you today!  Today we discussed your diabetes, your A1c was 7.2. I started you on metformin 500 mg, please take this twice daily.  Congratulations on all the progress you have made, please try to cut down on sodas and limit to now 1L a week instead of 2L for our next goal. Continue to eat healthy, try to incorporate more vegetables and set aside time to exercise regularly.   Please make an appointment with the eye doctor at your earliest convenience.   Please follow up at your next scheduled appointment in 1 month, if anything arises between now and then, please don't hesitate to contact our office.   Thank you for allowing [place] to be a part of your medical care!  Thank you, Dr. Robyne Peers

## 2020-07-30 NOTE — Progress Notes (Signed)
a1c

## 2020-07-30 NOTE — Assessment & Plan Note (Signed)
-  A1c 7.2, currently not at goal as diet-controlled -start metformin XR 500 mg bid to avoid GI side effects, discussed common side effects -extensive diet and exercise counseling -goal to cut down to 1L of soda a week -f/u in 1 month

## 2020-09-03 ENCOUNTER — Other Ambulatory Visit: Payer: Self-pay

## 2020-09-03 ENCOUNTER — Encounter: Payer: Self-pay | Admitting: Family Medicine

## 2020-09-03 ENCOUNTER — Ambulatory Visit (INDEPENDENT_AMBULATORY_CARE_PROVIDER_SITE_OTHER): Payer: 59 | Admitting: Family Medicine

## 2020-09-03 VITALS — BP 137/73 | HR 64 | Ht 74.0 in | Wt >= 6400 oz

## 2020-09-03 DIAGNOSIS — E119 Type 2 diabetes mellitus without complications: Secondary | ICD-10-CM | POA: Diagnosis not present

## 2020-09-03 DIAGNOSIS — Z6841 Body Mass Index (BMI) 40.0 and over, adult: Secondary | ICD-10-CM | POA: Diagnosis not present

## 2020-09-03 DIAGNOSIS — E781 Pure hyperglyceridemia: Secondary | ICD-10-CM

## 2020-09-03 MED ORDER — ATORVASTATIN CALCIUM 10 MG PO TABS: 10.0000 mg | ORAL_TABLET | Freq: Every day | ORAL | 0 refills | Status: DC

## 2020-09-03 NOTE — Assessment & Plan Note (Signed)
-  continue metformin as previously prescribed -extensive diet and exercise counseling provided along with handout given -instructed to reschedule ophthalmology appointment and maintain care annually  -follow up in 2 months for repeat A1c, considering adding GLP for weight management at next visit

## 2020-09-03 NOTE — Assessment & Plan Note (Signed)
-  refills of atorvastatin provided -diet and exercise counseling provided

## 2020-09-03 NOTE — Progress Notes (Signed)
    SUBJECTIVE:   CHIEF COMPLAINT / HPI:   Patient presents for diabetes check. Most recent A1c on 7/5 on 7.2. Started on metformin 500 mg at this time, compliant on this medication and overall tolerating well. States that he initially would occasionally experience diarrhea only when he would eat fried or greasy foods but not at other times when he has a more healthy diet. Has been drinking no more than 1.5 L of soda a week. Initially scheduled ophthalmology appointment but had to reschedule to and has not done so yet.   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 Denies any issues having trouble falling or staying asleep. Has been told that he snores heavily.   PERTINENT  PMH / PSH:   Hypertriglyceridemia Patient compliant on atorvastatin 10 mg daily. Needs refills. Stays active throughout the day due to his job. Eats healthy some days and less healthy other days. Diet on healthy days include baked chicken and mixed vegetables. Sometimes eats fast food for convenience.   OBJECTIVE:   BP 137/73   Pulse 64   Ht 6\' 2"  (1.88 m)   Wt (!) 409 lb (185.5 kg)   SpO2 99%   BMI 52.51 kg/m   General: Patient well-appearing, in no acute distress. CV: RRR, no murmurs or gallops auscultated Resp: CTAB Ext: radial and distal pulses strong and equal bilaterally, foot exam completed with gross sensation intact along with normal microfilament testing, normal DTRs, no wounds noted, no rashes or lesions noted  Neuro: normal gait Psych: mood appropriate   ASSESSMENT/PLAN:   Diabetes mellitus (HCC) -continue metformin as previously prescribed -extensive diet and exercise counseling provided along with handout given -instructed to reschedule ophthalmology appointment and maintain care annually  -follow up in 2 months for repeat A1c, considering adding GLP for weight management at next visit   Hypertriglyceridemia -refills of atorvastatin provided -diet and exercise counseling  provided    -Referral for sleep study placed.  -PHQ-9 score of 0, reviewed.   , DO [place] [facility] Medicine Center

## 2020-09-03 NOTE — Patient Instructions (Addendum)
It was great seeing you today!  Today we discussed your diabetes. Continue to take metformin as prescribed. Please try your best to eat a balanced diet as we discussed with at least half the plate being vegetables. Please reschedule your appointment with the ophthalmologist.   I have provided a refill of atorvastatin, please continue to take this daily.   I have also placed a referral to get a sleep study, if you do not hear from them within 1-2 weeks, please reach out to our office for assistance with scheduling.  Please follow up at your next scheduled appointment in 2 months, if anything arises between now and then, please don't hesitate to contact our office.   Thank you for allowing [place] to be a part of your medical care!  Thank you, Dr. Robyne Peers    Diet Recommendations for Diabetes  Carbohydrate includes starch, sugar, and fiber.  Of these, only sugar and starch raise blood glucose.  (Fiber is found in fruits, vegetables [especially skin, seeds, and stalks], whole grains, and beans.)   Starchy (carb) foods: Bread, rice, pasta, potatoes, corn, cereal, grits, crackers, bagels, muffins, all baked goods.  (Fruit, milk, and yogurt also have carbohydrate, but most of these foods will not spike your blood sugar as most starchy or sweet foods will.)  A few fruits do cause high blood sugars; use small portions of bananas (limit to 1/2 at a time), grapes, watermelon, and oranges.   Protein foods: Meat, fish, poultry, eggs, dairy foods, and beans such as pinto and kidney beans (beans also provide carbohydrate).   1. Eat at least 3 REAL meals and 1-2 snacks per day. Eat breakfast within the first hour of getting up.  Have something to eat at least every 5 hours while awake.   2. Limit starchy foods to TWO per meal and ONE per snack. ONE portion of a starchy food is equal to the following:   - ONE slice of bread (or its equivalent, such as half of a hamburger bun).   - 1/2 cup of a "scoopable" starchy  food such as potatoes or rice.   - 15 grams of Total Carbohydrate as shown on food label.   - Every 4 ounces of a sweet drink (including fruit juice). 3. Include twice the volume of vegetables as protein or carbohydrate foods for BOTH lunch and dinner as often as you can.   - Fresh or frozen vegetables are best.   - Keep frozen vegetables on hand for a quick option.

## 2020-12-13 ENCOUNTER — Other Ambulatory Visit: Payer: Self-pay | Admitting: Family Medicine

## 2020-12-13 DIAGNOSIS — E781 Pure hyperglyceridemia: Secondary | ICD-10-CM

## 2021-01-13 ENCOUNTER — Ambulatory Visit (INDEPENDENT_AMBULATORY_CARE_PROVIDER_SITE_OTHER): Payer: 59 | Admitting: Family Medicine

## 2021-01-13 ENCOUNTER — Other Ambulatory Visit: Payer: Self-pay

## 2021-01-13 ENCOUNTER — Encounter: Payer: Self-pay | Admitting: Family Medicine

## 2021-01-13 VITALS — BP 136/64 | HR 69 | Ht 74.0 in | Wt >= 6400 oz

## 2021-01-13 DIAGNOSIS — Z23 Encounter for immunization: Secondary | ICD-10-CM

## 2021-01-13 DIAGNOSIS — E1159 Type 2 diabetes mellitus with other circulatory complications: Secondary | ICD-10-CM

## 2021-01-13 DIAGNOSIS — E781 Pure hyperglyceridemia: Secondary | ICD-10-CM

## 2021-01-13 DIAGNOSIS — E119 Type 2 diabetes mellitus without complications: Secondary | ICD-10-CM

## 2021-01-13 DIAGNOSIS — I152 Hypertension secondary to endocrine disorders: Secondary | ICD-10-CM

## 2021-01-13 MED ORDER — ATORVASTATIN CALCIUM 10 MG PO TABS: ORAL_TABLET | ORAL | 0 refills | Status: DC

## 2021-01-13 NOTE — Assessment & Plan Note (Signed)
-  referral for sleep study for work placed

## 2021-01-13 NOTE — Assessment & Plan Note (Signed)
-  BP 136/64, at goal

## 2021-01-13 NOTE — Assessment & Plan Note (Signed)
-  unable to get A1c due to lab issues, patient instructed to follow up for lab visit later this week or next week at earliest convenience, patient agrees -will made med adjustments as needed, continue metformin 500 mg bid, consider increasing to 1000 mg bid if appropriate -nutrition extensively discussed, handout provided, new goal of 2-3 sodas in a week -follow up in 3 months for next A1c check

## 2021-01-13 NOTE — Patient Instructions (Signed)
It was great seeing you today!  Today we discussed your diabetes. I am sorry we could not check your A1c today, please return sometime later this week or next week for a lab visit to check this at your earliest convenience. Please continue to make the healthy changes that you are making. Make sure to continue to incorporate vegetables into your diet and continue to limit sodas, with the new goal of 1-2 sodas in a week.   Please follow up at your next scheduled appointment next week and then return to see me in 3 months, if anything arises between now and then, please don't hesitate to contact our office.   Thank you for allowing [place] to be a part of your medical care!  Thank you, Dr. Robyne Peers

## 2021-01-13 NOTE — Progress Notes (Signed)
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 He lifted a heavy package and injured his back causing a muscle sprain. Had to be out of work for 4 months and then placed on light duty. Recently returned to his regular schedule. Went to physical therapy but feels fine now. DOT expired in October but was not able to get his sleep study done due to his injury. Requesting another referral.   Type 2 DM Last A1c 7.2 in July 2022. Feels like he has been making improvements. Drinking sodas, lessens his original amount. Wife now buys the cans, 1 can every 2-3 days. Has been incorporating more vegetables especially at work. Feels that his diet is about the same from his last visit, meals sometime occur at different times due to his job. Loves chicken and fish so eats those often. Sometimes indulges in dessert Overall compliant on metformin as prescribed, has missed a few doses but this only has occurred a few times since his last visit. He says he is bad about taking medication. Denies dizziness or weakness to indicate hypoglycemic episodes. Upcoming eye doctor visit on 02/03/2021.   HLD Compliant on statin and needs more refills. Tolerating medication well without experiences myalgias. Continuing to make changes to his diet as above.    OBJECTIVE:   BP 136/64    Pulse 69    Ht 6\' 2"  (1.88 m)    Wt (!) 421 lb 2 oz (191 kg)    SpO2 98%    BMI 54.07 kg/m   General: Patient well-appearing, in no acute distress. CV: RRR, no murmurs or gallops auscultated Resp: CTAB, no wheezing or rales noted Ext: radial pulses strong and equal bilaterally, no LE edema noted bilaterally  Neuro: normal gait  Psych: mood appropriate   ASSESSMENT/PLAN:   Hypertension associated with diabetes (HCC) -BP 136/64, at goal    Diabetes mellitus (HCC) -unable to get A1c due to lab issues, patient instructed to follow up for lab visit  later this week or next week at earliest convenience, patient agrees -will made med adjustments as needed, continue metformin 500 mg bid, consider increasing to 1000 mg bid if appropriate -nutrition extensively discussed, handout provided, new goal of 2-3 sodas in a week -follow up in 3 months for next A1c check  Hypertriglyceridemia -tolerating statin well, continue statin therapy, refills sent to desired pharmacy  -plan to repeat lipid panel in 2-3 months   Obesity, morbid (HCC) -referral for sleep study for work placed     -PHQ-9 score of 0 reviewed.   , DO [facility]

## 2021-01-13 NOTE — Assessment & Plan Note (Signed)
-  tolerating statin well, continue statin therapy, refills sent to desired pharmacy  -plan to repeat lipid panel in 2-3 months

## 2021-01-21 ENCOUNTER — Other Ambulatory Visit: Payer: 59

## 2021-01-28 ENCOUNTER — Other Ambulatory Visit (INDEPENDENT_AMBULATORY_CARE_PROVIDER_SITE_OTHER): Payer: Self-pay

## 2021-01-28 ENCOUNTER — Other Ambulatory Visit: Payer: 59

## 2021-01-28 ENCOUNTER — Other Ambulatory Visit: Payer: Self-pay

## 2021-01-28 ENCOUNTER — Telehealth: Payer: Self-pay | Admitting: Family Medicine

## 2021-01-28 DIAGNOSIS — E119 Type 2 diabetes mellitus without complications: Secondary | ICD-10-CM

## 2021-01-28 LAB — POCT GLYCOSYLATED HEMOGLOBIN (HGB A1C): HbA1c, POC (controlled diabetic range): 8.9 % — AB (ref 0.0–7.0)

## 2021-01-28 NOTE — Telephone Encounter (Signed)
Patient states that he had a sleep study referral placed 2 weeks ago, and has not heard anything yet. He would like a status update if possible please.

## 2021-01-29 ENCOUNTER — Telehealth: Payer: Self-pay | Admitting: Family Medicine

## 2021-01-29 NOTE — Telephone Encounter (Signed)
Checked on order and it was placed incorrectly.  Will ask provider to change to [id number] for split night sleep study and to choose [facility] sleep [facility] as the location.  Provider can also put in a referral for pulmonology and they will perform the sleep studies too.  Dyer Klug,CMA

## 2021-01-29 NOTE — Telephone Encounter (Signed)
Corrected, new order in place.

## 2021-01-30 ENCOUNTER — Other Ambulatory Visit: Payer: Self-pay | Admitting: Family Medicine

## 2021-01-30 DIAGNOSIS — E119 Type 2 diabetes mellitus without complications: Secondary | ICD-10-CM

## 2021-01-30 MED ORDER — METFORMIN HCL ER 500 MG PO TB24: 1000.0000 mg | ORAL_TABLET | Freq: Two times a day (BID) | ORAL | 6 refills | Status: DC

## 2021-01-30 NOTE — Telephone Encounter (Signed)
Had attempted to call patient unsuccessfully regarding A1c result. This has now been addressed by PCP.

## 2021-02-27 ENCOUNTER — Telehealth: Payer: Self-pay

## 2021-02-27 NOTE — Telephone Encounter (Signed)
Patient calls nurse line regarding increased urination and increased thirst that has been going on for the last month. Patient believes that these symptoms correlate with when he increased dosage of metformin.   Patient has no other symptoms at this time. Advised patient that he would need to schedule follow up appointment to address this concern further. Offered him our next available, however, patient declined stating that he only wanted to see Dr. Robyne Peers. Scheduled for Dr. Judie Grieve next available on 2/28. Provided with strict ED precautions.   Veronda Prude, RN

## 2021-03-03 ENCOUNTER — Other Ambulatory Visit: Payer: Self-pay | Admitting: Family Medicine

## 2021-03-07 ENCOUNTER — Other Ambulatory Visit: Payer: Self-pay

## 2021-03-07 ENCOUNTER — Encounter: Payer: Self-pay | Admitting: Student

## 2021-03-07 ENCOUNTER — Ambulatory Visit (INDEPENDENT_AMBULATORY_CARE_PROVIDER_SITE_OTHER): Payer: PRIVATE HEALTH INSURANCE | Admitting: Student

## 2021-03-07 VITALS — BP 146/90 | HR 95 | Wt 397.2 lb

## 2021-03-07 DIAGNOSIS — E119 Type 2 diabetes mellitus without complications: Secondary | ICD-10-CM

## 2021-03-07 DIAGNOSIS — N481 Balanitis: Secondary | ICD-10-CM | POA: Diagnosis not present

## 2021-03-07 LAB — POCT GLYCOSYLATED HEMOGLOBIN (HGB A1C): HbA1c, POC (controlled diabetic range): 11.8 % — AB (ref 0.0–7.0)

## 2021-03-07 MED ORDER — CLOTRIMAZOLE 1 % EX CREA: 1.0000 "application " | TOPICAL_CREAM | Freq: Two times a day (BID) | CUTANEOUS | 0 refills | Status: DC

## 2021-03-07 NOTE — Assessment & Plan Note (Addendum)
Appears candidal in nature and additionally most likely given uncontrolled T2DM.  Clotrimazole 1% cream twice daily x2 weeks.  Application directions in AVS and further discussed with patient.

## 2021-03-07 NOTE — Progress Notes (Signed)
°  SUBJECTIVE:   CHIEF COMPLAINT / HPI:   Inflammation around the head of his penis: 2 weeks ago, patient noted nausea and dry around the head of his penis.  Notes that he would wet the area and dry it with a tissue.  The tissue would stay in place and then when he urinated would get urine around it and stick on his penis.  Recently, he used dermaseal lotion to calm the irritation he was feeling.  Notes he has never had a reaction to that lotion before as he is on his skin.  He experiences a burning sensation and itching when the head of his penis is touched.  Denies dysuria or hematuria.  He has also tried Vaseline and aloe vera x2 days.  Patient is that he has had STDs in the past but never since his current marriage and is not concerned with STDs at this time.  T2DM: A1c on 01/28/2021 8.9.  He was advised at that time to take metformin 1000 mg twice daily which he endorses taking.  Patient endorses recent frequency of having to urinate sometimes 3-4 times in an hour.  Additionally he is experiencing dry mouth but is able to stay well-hydrated.   PERTINENT  PMH / PSH:   Past Medical History:  Diagnosis Date   Diabetes mellitus without complication ([place])    Hypertension     OBJECTIVE:  BP (!) 146/90    Pulse 95    Wt (!) 397 lb 3.2 oz (180.2 kg)    SpO2 97%    BMI 51.00 kg/m   General: NAD, pleasant, able to participate in exam Abdomen: Normal nontender skin in the fold below abdominal pannus GU: Blotchy erythema on the glans of the penis with minimal white discharge around the corona c/w candidal balanitis, surrounding penile skin appearing to be unaffected and un-irritated  ASSESSMENT/PLAN:  Diabetes mellitus (HCC) POCT A1c 11.8 today, increased from 8.9 on 01/28/2021.  Endorses consistent metformin use of 1000 mg twice daily.  In the setting of morbid obesity and drastic increase in A1c, T2DM clearly poorly controlled.  Would not advise SGLT2 currently in the setting of candidal  balanitis.  Patient would greatly benefit from GLP-1 agonist initiation.  Discussed this with patient in detail and gave him medication names to look up at home and return for follow-up in 1 week for discussion and initiation at that time.  Balanitis Appears candidal in nature and additionally most likely given uncontrolled T2DM.  Clotrimazole 1% cream twice daily x2 weeks.  Application directions in AVS and further discussed with patient.   Orders Placed This Encounter  Procedures   HgB A1c   Meds ordered this encounter  Medications   clotrimazole (LOTRIMIN) 1 % cream    Sig: Apply 1 application topically 2 (two) times daily.    Dispense:  30 g    Refill:  0   Return in about 1 week (around 03/14/2021) for diabetes follow up. Wells Guiles, DO 03/07/2021, 7:41 PM PGY-1, [place]

## 2021-03-07 NOTE — Assessment & Plan Note (Addendum)
POCT A1c 11.8 today, increased from 8.9 on 01/28/2021.  Endorses consistent metformin use of 1000 mg twice daily.  In the setting of morbid obesity and drastic increase in A1c, T2DM clearly poorly controlled.  Would not advise SGLT2 currently in the setting of candidal balanitis.  Patient would greatly benefit from GLP-1 agonist initiation.  Discussed this with patient in detail and gave him medication names to look up at home and return for follow-up in 1 week for further discussion and initiation at that time.

## 2021-03-07 NOTE — Patient Instructions (Addendum)
It was great to see you today! Thank you for choosing [facility] for your primary care. Anthony Collier was seen for inflammation on head of penis.  Today we addressed: Balanitis: this is a condition that can be caused by several different reasons but in this situation I believe it is due to a fungal infection.  I prescribed clotrimazole 1% cream.  I recommend that you clean the area with a gentle hypoallergenic soap and pat it dry with a washcloth.  After drying, apply clotrimazole cream and allow the area to air out.  Do this twice daily for 2 weeks.  If you do not begin to see any improvement within a week, please return. Type 2 diabetes: I am checking A1c today in preparation for your visit with your PCP on the 28th.  There is a diabetic medication that can aid with diabetic control in addition to assisting with weight loss.  I recommend you discuss this with your PCP at your next visit given that uncontrolled diabetes may increase her chances for fungal infections.   Orders Placed This Encounter  Procedures   HgB A1c   Meds ordered this encounter  Medications   clotrimazole (LOTRIMIN) 1 % cream    Sig: Apply 1 application topically 2 (two) times daily.    Dispense:  30 g    Refill:  0    We are checking some labs today. If they are abnormal, I will call you. If they are normal, I will send you a MyChart message (if it is active) or a letter in the mail. If you do not hear about your labs in the next 2 weeks, please call the office.  You should return to our clinic on 2/28 with your PCP.  I recommend that you always bring your medications to each appointment as this makes it easy to ensure you are on the correct medications and helps [place] not miss refills when you need them.  Please arrive 15 minutes before your appointment to ensure smooth check in process.  We appreciate your efforts in making this happen.  Take care and seek immediate care sooner if you develop any  concerns.   Thank you for allowing me to participate in your care, Shelby Mattocks, DO 03/07/2021, 4:59 PM PGY-1, [facility]

## 2021-03-12 NOTE — Patient Instructions (Addendum)
It was great to see you today! Thank you for choosing [facility] for your primary care. Anthony Collier was seen for type 2 diabetes and balanitis.  Today we addressed: T2DM: We are starting you on Trulicity. We are also checking your urine today.  Balanitis: I am glad it is improving. Please continue using the clotrimazole for the next week.  High cholesterol: We are rechecking your cholesterol levels and other blood work.   Orders Placed This Encounter  Procedures   Microalbumin/Creatinine Ratio, Urine   Lipid Panel   Basic Metabolic Panel   Meds ordered this encounter  Medications   Dulaglutide (TRULICITY) [id number] SOPN    Sig: Inject 0.75 mg into the skin once a week.    Dispense:  2 mL    Refill:  0   We are checking some labs today. If they are abnormal, I will call you. If they are normal, I will send you a MyChart message (if it is active) or a letter in the mail. If you do not hear about your labs in the next 2 weeks, please call the office.  You should return to our clinic Return in about 4 weeks (around 04/10/2021) for T2DM follow up..  I recommend that you always bring your medications to each appointment as this makes it easy to ensure you are on the correct medications and helps [place] not miss refills when you need them.  Please arrive 15 minutes before your appointment to ensure smooth check in process.  We appreciate your efforts in making this happen.  Take care and seek immediate care sooner if you develop any concerns.   Thank you for allowing me to participate in your care, Wells Guiles, DO 03/13/2021, 4:04 PM PGY-1, [place]

## 2021-03-12 NOTE — Progress Notes (Signed)
°  SUBJECTIVE:   CHIEF COMPLAINT / HPI:   T2DM: Last A1c 11.8 on 03/07/2021 increased from 8.9 on 01/28/2021.  Patient endorses taking metformin 1000 mg twice daily.  Previously endorsed increased frequency.  Balanitis: For the last week has been using clotrimazole twice daily.  States it has greatly improved with the medication.  HLD: last labs on 03/14/2020.  Endorses taking atorvastatin 10 mg daily.  PERTINENT  PMH / PSH: T2DM, HTN, morbid obesity  OBJECTIVE:  BP 126/78    Pulse 81    Wt (!) 394 lb 12.8 oz (179.1 kg)    SpO2 95%    BMI 50.69 kg/m   General: NAD, pleasant, able to participate in exam Cardiac: RRR, no murmurs auscultated. Respiratory: CTAB, normal effort, no wheezes, rales or rhonchi  ASSESSMENT/PLAN:  Hypertriglyceridemia Endorses taking statin everyday. Last lipid panel on 03/14/20. Lipid panel and BMP today.  Patient has history of hypertriglyceridemia and T2DM, consider increasing to high intensity statin with goal of LDL less than 100.  Diabetes mellitus (HCC) A1c 1 week ago 11.8.  Patient continues to endorse metformin 1000 mg twice daily. Patient states he has eye exam coming up.  Check urine microalbumin today.  Start Trulicity 0.75 mg weekly.  Received education by pharmacy.  Increase dosage at 4-week visit.  Balanitis Greatly improved per patient, continue medication use for 1 more week.   Orders Placed This Encounter  Procedures   Microalbumin/Creatinine Ratio, Urine   Lipid Panel   Basic Metabolic Panel   Meds ordered this encounter  Medications   Dulaglutide (TRULICITY) 0.75 MG/0.5ML SOPN    Sig: Inject 0.75 mg into the skin once a week.    Dispense:  2 mL    Refill:  0   Return in about 4 weeks (around 04/10/2021) for T2DM follow up. Shelby Mattocks, DO 03/13/2021, 5:35 PM PGY-1, [facility]

## 2021-03-13 ENCOUNTER — Encounter: Payer: Self-pay | Admitting: Student

## 2021-03-13 ENCOUNTER — Other Ambulatory Visit: Payer: Self-pay

## 2021-03-13 ENCOUNTER — Other Ambulatory Visit (HOSPITAL_COMMUNITY): Payer: Self-pay

## 2021-03-13 ENCOUNTER — Ambulatory Visit (INDEPENDENT_AMBULATORY_CARE_PROVIDER_SITE_OTHER): Payer: PRIVATE HEALTH INSURANCE | Admitting: Student

## 2021-03-13 VITALS — BP 126/78 | HR 81 | Wt 394.8 lb

## 2021-03-13 DIAGNOSIS — N481 Balanitis: Secondary | ICD-10-CM

## 2021-03-13 DIAGNOSIS — E781 Pure hyperglyceridemia: Secondary | ICD-10-CM | POA: Diagnosis not present

## 2021-03-13 DIAGNOSIS — E119 Type 2 diabetes mellitus without complications: Secondary | ICD-10-CM

## 2021-03-13 MED ORDER — TRULICITY 0.75 MG/0.5ML ~~LOC~~ SOAJ: 0.7500 mg | SUBCUTANEOUS | 0 refills | Status: DC

## 2021-03-13 NOTE — Assessment & Plan Note (Signed)
Greatly improved per patient, continue medication use for 1 more week.

## 2021-03-13 NOTE — Assessment & Plan Note (Addendum)
Endorses taking statin everyday. Last lipid panel on 03/14/20. Lipid panel and BMP today.  Patient has history of hypertriglyceridemia and T2DM, consider increasing to high intensity statin with goal of LDL less than 100.

## 2021-03-13 NOTE — Assessment & Plan Note (Addendum)
A1c 1 week ago 11.8.  Patient continues to endorse metformin 1000 mg twice daily. Patient states he has eye exam coming up.  Check urine microalbumin today.  Start Trulicity 0.75 mg weekly.  Received education by pharmacy.  Increase dosage at 4-week visit.

## 2021-03-14 LAB — MICROALBUMIN / CREATININE URINE RATIO
Creatinine, Urine: 57.6 mg/dL
Microalb/Creat Ratio: 7 mg/g creat (ref 0–29)
Microalbumin, Urine: 4.1 ug/mL

## 2021-03-14 LAB — LIPID PANEL
Chol/HDL Ratio: 3 ratio (ref 0.0–5.0)
Cholesterol, Total: 118 mg/dL (ref 100–199)
HDL: 39 mg/dL — ABNORMAL LOW (ref >39)
LDL Chol Calc (NIH): 60 mg/dL (ref 0–99)
Triglycerides: 101 mg/dL (ref 0–149)
VLDL Cholesterol Cal: 19 mg/dL (ref 5–40)

## 2021-03-14 LAB — BASIC METABOLIC PANEL
BUN/Creatinine Ratio: 7 — ABNORMAL LOW (ref 9–20)
BUN: 8 mg/dL (ref 6–24)
CO2: 23 mmol/L (ref 20–29)
Calcium: 9.8 mg/dL (ref 8.7–10.2)
Chloride: 100 mmol/L (ref 96–106)
Creatinine, Ser: 1.08 mg/dL (ref 0.76–1.27)
Glucose: 410 mg/dL — ABNORMAL HIGH (ref 70–99)
Potassium: 5 mmol/L (ref 3.5–5.2)
Sodium: 137 mmol/L (ref 134–144)
eGFR: 87 mL/min/{1.73_m2} (ref >59)

## 2021-03-25 ENCOUNTER — Other Ambulatory Visit (HOSPITAL_COMMUNITY): Payer: Self-pay

## 2021-03-25 ENCOUNTER — Encounter: Payer: Self-pay | Admitting: Student

## 2021-03-25 ENCOUNTER — Ambulatory Visit: Payer: PRIVATE HEALTH INSURANCE | Admitting: Family Medicine

## 2021-03-27 ENCOUNTER — Encounter: Payer: Self-pay | Admitting: Adult Health

## 2021-03-27 ENCOUNTER — Ambulatory Visit (INDEPENDENT_AMBULATORY_CARE_PROVIDER_SITE_OTHER): Payer: PRIVATE HEALTH INSURANCE | Admitting: Adult Health

## 2021-03-27 ENCOUNTER — Other Ambulatory Visit: Payer: Self-pay

## 2021-03-27 VITALS — BP 130/74 | HR 87 | Temp 98.5°F | Ht 74.0 in | Wt 388.8 lb

## 2021-03-27 DIAGNOSIS — R0683 Snoring: Secondary | ICD-10-CM

## 2021-03-27 DIAGNOSIS — G4719 Other hypersomnia: Secondary | ICD-10-CM | POA: Diagnosis not present

## 2021-03-27 NOTE — Progress Notes (Signed)
? ?@Patient  ID: Anthony Collier , male    DOB: 1975/03/06, 46 y.o.   MRN: [id number] ? ?Chief Complaint  ?Patient presents with  ? sleep consult   ? ? ?Referring provider: ?[id number], * ? ?HPI: ?46 year old male seen for sleep consult for March 27, 2021 for daytime sleepiness, snoring and restless sleep. ?Medical history significant for diabetes and morbid obesity with BMI at 49 ? ?TEST/EVENTS :  ? ?03/27/2021 Sleep Consult  ?Patient presents for sleep consult today.  Patient complains of snoring.  Has mild daytime sleepiness snoring. Typically goes to bed about 10:30 PM.  Takes only about 10 minutes to go to sleep.  Does not get up throughout the night.  And is up usually about 8 AM.  Patient has been working on weight loss and is down about 40 pounds over the last 2 years.  He does operate heavy machinery for work.  He has never had a sleep study before.  Main concern is that he snores very loudly. ?Epworth score is 1. ?Denies any symptoms suspicious for cataplexy or sleep paralysis. ?Daily Caffeine intake -rare energy drink . Cutting back on sodas.  ?Was referred over from PCP due to DOT physical . Needs to be cleared before he can go back to work.  ? ?Social history patient is a 05/27/2021.  He is married.  Has 5 children.  Does not smoke.  No drugs or alcohol. ? ?Family history is positive for chronic allergies, HTN  ? ?Surgical history is negative. ? ?Medical history significant for diabetes high cholesterol and hypertension. ? ? ? ? ?No Known Allergies ? ?Immunization History  ?Administered Date(s) Administered  ? Influenza,inj,Quad PF,6+ Mos 10/23/2016, 03/02/2018, 03/14/2020, 01/13/2021  ? Influenza-Unspecified 10/25/2016  ? PFIZER Comirnaty(Gray Top)Covid-19 Tri-Sucrose Vaccine 07/30/2020  ? PFIZER(Purple Top)SARS-COV-2 Vaccination 08/23/2019, 09/13/2019  ? Pneumococcal Polysaccharide-23 06/11/2017  ? Tdap 07/05/2012  ? ? ?Past Medical History:  ?Diagnosis Date  ? Diabetes mellitus without  complication (HCC)   ? Hypertension   ? ? ?Tobacco History: ?Social History  ? ?Tobacco Use  ?Smoking Status Never  ?Smokeless Tobacco Never  ? ?Counseling given: Not Answered ? ? ?Outpatient Medications Prior to Visit  ?Medication Sig Dispense Refill  ? atorvastatin (LIPITOR) 10 MG tablet TAKE 1 TABLET(10 MG) BY MOUTH DAILY 90 tablet 0  ? Dulaglutide (TRULICITY) 0.75 MG/0.5ML SOPN Inject 0.75 mg into the skin once a week. 2 mL 0  ? metFORMIN (GLUCOPHAGE-XR) 500 MG 24 hr tablet Take 2 tablets (1,000 mg total) by mouth in the morning and at bedtime. 120 tablet 6  ? clotrimazole (LOTRIMIN) 1 % cream Apply 1 application topically 2 (two) times daily. 30 g 0  ? ?No facility-administered medications prior to visit.  ? ? ? ?Review of Systems:  ? ?Constitutional:   No  weight loss, night sweats,  Fevers, chills, fatigue, or  lassitude. ? ?HEENT:   No headaches,  Difficulty swallowing,  Tooth/dental problems, or  Sore throat,  ?              No sneezing, itching, ear ache, nasal congestion, post nasal drip,  ? ?CV:  No chest pain,  Orthopnea, PND, swelling in lower extremities, anasarca, dizziness, palpitations, syncope.  ? ?GI  No heartburn, indigestion, abdominal pain, nausea, vomiting, diarrhea, change in bowel habits, loss of appetite, bloody stools.  ? ?Resp: No shortness of breath with exertion or at rest.  No excess mucus, no productive cough,  No non-productive cough,  No coughing  up of blood.  No change in color of mucus.  No wheezing.  No chest wall deformity ? ?Skin: no rash or lesions. ? ?GU: no dysuria, change in color of urine, no urgency or frequency.  No flank pain, no hematuria  ? ?MS:  No joint pain or swelling.  No decreased range of motion.  No back pain. ? ? ? ?Physical Exam ? ?BP 130/74 (BP Location: Left Arm, Patient Position: Sitting, Cuff Size: Normal)   Pulse 87   Temp 98.5 ?F (36.9 ?C) (Oral)   Ht 6\' 2"  (1.88 m)   Wt (!) 388 lb 12.8 oz (176.4 kg)   SpO2 95%   BMI 49.92 kg/m?  ? ?GEN: A/Ox3;  pleasant , NAD, BMI 6 ?  ?HEENT:  Center/AT,  EACs-clear, TMs-wnl, NOSE-clear, THROAT-clear, no lesions, no postnasal drip or exudate noted.  ? ?NECK:  Supple w/ fair ROM; no JVD; normal carotid impulses w/o bruits; no thyromegaly or nodules palpated; no lymphadenopathy.   ? ?RESP  Clear  P & A; w/o, wheezes/ rales/ or rhonchi. no accessory muscle use, no dullness to percussion ? ?CARD:  RRR, no m/r/g, no peripheral edema, pulses intact, no cyanosis or clubbing. ? ?GI:   Soft & nt; nml bowel sounds; no organomegaly or masses detected.  ? ?Musco: Warm bil, no deformities or joint swelling noted.  ? ?Neuro: alert, no focal deficits noted.   ? ?Skin: Warm, no lesions or rashes ? ? ? ?Lab Results: ? ?CBC ?   ?Component Value Date/Time  ? WBC 6.3 03/14/2020 1436  ? WBC 6.8 04/21/2017 1830  ? RBC 4.75 03/14/2020 1436  ? RBC 4.69 04/21/2017 1830  ? HGB 12.7 (L) 03/14/2020 1436  ? HCT 40.6 03/14/2020 1436  ? PLT 264 03/14/2020 1436  ? MCV 86 03/14/2020 1436  ? MCH 26.7 03/14/2020 1436  ? MCH 26.9 04/21/2017 1830  ? MCHC 31.3 (L) 03/14/2020 1436  ? MCHC 31.3 04/21/2017 1830  ? RDW 13.6 03/14/2020 1436  ? LYMPHSABS 1.8 03/14/2020 1436  ? EOSABS 0.1 03/14/2020 1436  ? BASOSABS 0.0 03/14/2020 1436  ? ? ?BMET ?   ?Component Value Date/Time  ? NA 137 03/13/2021 1633  ? K 5.0 03/13/2021 1633  ? CL 100 03/13/2021 1633  ? CO2 23 03/13/2021 1633  ? GLUCOSE 410 (H) 03/13/2021 1633  ? GLUCOSE 96 04/21/2017 1830  ? BUN 8 03/13/2021 1633  ? CREATININE 1.08 03/13/2021 1633  ? CALCIUM 9.8 03/13/2021 1633  ? GFRNONAA 105 03/14/2020 1436  ? GFRAA 121 03/14/2020 1436  ? ? ?BNP ?No results found for: BNP ? ?ProBNP ?No results found for: PROBNP ? ?Imaging: ?No results found. ? ? ? ?No flowsheet data found. ? ?No results found for: NITRICOXIDE ? ? ? ? ? ?Assessment & Plan:  ? ?Snoring ?Loud snoring, morbid obesity with BMI of 49, increased neck circumference I will place patient at risk for underlying sleep apnea.  Patient education was given on  sleep apnea.  We will set patient up for home sleep study ? ?- discussed how weight can impact sleep and risk for sleep disordered breathing ?- discussed options to assist with weight loss: combination of diet modification, cardiovascular and strength training exercises ?  ?- had an extensive discussion regarding the adverse health consequences related to untreated sleep disordered breathing ?- specifically discussed the risks for hypertension, coronary artery disease, cardiac dysrhythmias, cerebrovascular disease, and diabetes ?- lifestyle modification discussed ?  ?- discussed how sleep disruption can increase  risk of accidents, particularly when driving ?- safe driving practices were discussed ?  ? ?Plan  ?Patient Instructions  ?Set up for home sleep study ?Work on healthy weight loss ?Do not drive if sleepy ?Healthy sleep regimen ?Follow-up in 2 months for follow-up to discuss results and possible treatment plan ?  ? ? ? ? ?Rubye Oaks, NP ?03/27/2021 ? ?

## 2021-03-27 NOTE — Patient Instructions (Signed)
Set up for home sleep study ?Work on healthy weight loss ?Do not drive if sleepy ?Healthy sleep regimen ?Follow-up in 2 months for follow-up to discuss results and possible treatment plan ?

## 2021-03-27 NOTE — Assessment & Plan Note (Signed)
Loud snoring, morbid obesity with BMI of 49, increased neck circumference I will place patient at risk for underlying sleep apnea.  Patient education was given on sleep apnea.  We will set patient up for home sleep study ? ?- discussed how weight can impact sleep and risk for sleep disordered breathing ?- discussed options to assist with weight loss: combination of diet modification, cardiovascular and strength training exercises ?  ?- had an extensive discussion regarding the adverse health consequences related to untreated sleep disordered breathing ?- specifically discussed the risks for hypertension, coronary artery disease, cardiac dysrhythmias, cerebrovascular disease, and diabetes ?- lifestyle modification discussed ?  ?- discussed how sleep disruption can increase risk of accidents, particularly when driving ?- safe driving practices were discussed ?  ? ?Plan  ?Patient Instructions  ?Set up for home sleep study ?Work on healthy weight loss ?Do not drive if sleepy ?Healthy sleep regimen ?Follow-up in 2 months for follow-up to discuss results and possible treatment plan ?  ? ?

## 2021-03-28 NOTE — Progress Notes (Signed)
Reviewed and agree with assessment/plan. ? ? ?Coralyn Helling, MD ?[facility] ?03/28/2021, 8:32 AM ?Pager:  [phone] ? ?

## 2021-04-01 ENCOUNTER — Encounter: Payer: Self-pay | Admitting: Adult Health

## 2021-04-07 ENCOUNTER — Other Ambulatory Visit: Payer: Self-pay

## 2021-04-07 ENCOUNTER — Ambulatory Visit: Payer: PRIVATE HEALTH INSURANCE

## 2021-04-07 DIAGNOSIS — G4733 Obstructive sleep apnea (adult) (pediatric): Secondary | ICD-10-CM

## 2021-04-07 DIAGNOSIS — G4719 Other hypersomnia: Secondary | ICD-10-CM

## 2021-04-10 DIAGNOSIS — G4733 Obstructive sleep apnea (adult) (pediatric): Secondary | ICD-10-CM | POA: Diagnosis not present

## 2021-04-11 ENCOUNTER — Encounter: Payer: Self-pay | Admitting: Family Medicine

## 2021-04-11 ENCOUNTER — Ambulatory Visit (INDEPENDENT_AMBULATORY_CARE_PROVIDER_SITE_OTHER): Payer: PRIVATE HEALTH INSURANCE | Admitting: Family Medicine

## 2021-04-11 ENCOUNTER — Other Ambulatory Visit: Payer: Self-pay

## 2021-04-11 ENCOUNTER — Telehealth: Payer: Self-pay | Admitting: Adult Health

## 2021-04-11 VITALS — BP 156/101 | HR 65 | Ht 74.0 in | Wt 386.6 lb

## 2021-04-11 DIAGNOSIS — E119 Type 2 diabetes mellitus without complications: Secondary | ICD-10-CM

## 2021-04-11 DIAGNOSIS — I10 Essential (primary) hypertension: Secondary | ICD-10-CM

## 2021-04-11 MED ORDER — LOSARTAN POTASSIUM 25 MG PO TABS: 25.0000 mg | ORAL_TABLET | Freq: Every day | ORAL | 3 refills | Status: DC

## 2021-04-11 MED ORDER — SEMAGLUTIDE(0.25 OR 0.5MG/DOS) 2 MG/1.5ML ~~LOC~~ SOPN: 0.2500 mg | PEN_INJECTOR | SUBCUTANEOUS | 1 refills | Status: DC

## 2021-04-11 NOTE — Telephone Encounter (Signed)
ATC x1, no answer, no VM.

## 2021-04-11 NOTE — Telephone Encounter (Signed)
Home sleep study completed on April 07, 2021 showed severe obstructive sleep apnea with AHI at 38.4/hour and SPO2 low at 71% please set up patient for a follow-up visit to discuss test results and treatment plan ?

## 2021-04-11 NOTE — Assessment & Plan Note (Signed)
-  BP 150/95, on repeat 156/101, not at goal of 140/90 or lower ?-started losartan 25 mg given renoprotective effects with DM diagnosis ?-follow up in 1 week for BP check and BMP  ?-strict ED precautions discussed  ?

## 2021-04-11 NOTE — Assessment & Plan Note (Addendum)
-  A1c last month 11.8, prescribed trulicity but not covered by insurance. Discussed whether starting ozempic would be an appropriate alternative with Dr. Raymondo Band who agrees. ?-prescription for ozempic 0.25 mg weekly sent to desired pharmacy, notified patient regarding this who is agreeable to change, will plan to increase to 0.5 mg in 4 weeks ?-next A1c due in 2 months ?-annual ophthalmology visits  ?-diet and exercise counseling extensively discussed, handout provided  ?

## 2021-04-11 NOTE — Progress Notes (Signed)
? ? ?  SUBJECTIVE:  ? ?CHIEF COMPLAINT / HPI:  ? ?Patient with history of diabetes, presents for diabetes follow up accompanied by his wife. He has cut out sodas completely. Sometimes smaller meals and less each time. Incorporates vegetables into his meals but they are not always healthy items within his diet. He is getting used to this change as he is not able to finish a larger meal now. Last saw ophthalmologist a few days ago. Joined a gym, tries to go at least 3 times a week. Unable to get trulicity due to insurance coverage but compliant on all other medications.  ? ?Elevated blood pressure ?Denies any chest pain, dyspnea, vision changes, headaches, weakness and leg swelling. Denies any related concerns. He is not on any medications for his blood pressure. ? ?OBJECTIVE:  ? ?BP (!) 156/101   Pulse 65   Ht 6\' 2"  (1.88 m)   Wt (!) 386 lb 9.6 oz (175.4 kg)   SpO2 99%   BMI 49.64 kg/m?   ?General: Patient well-appearing, in no acute distress. ?CV: RRR, no murmurs or gallops auscultated ?Resp: CTAB, no wheezing, rales or rhonchi noted ?Ext: no LE edema noted bilaterally ?Psych: mood appropriate  ? ?ASSESSMENT/PLAN:  ? ?Diabetes mellitus (HCC) ?-A1c last month 11.8, prescribed trulicity but not covered by insurance. Discussed whether starting ozempic would be an appropriate alternative with Dr. who agrees. ?-prescription for ozempic 0.25 mg weekly sent to desired pharmacy, notified patient regarding this who is agreeable to change, will plan to increase to 0.5 mg in 4 weeks ?-next A1c due in 2 months ?-annual ophthalmology visits  ?-diet and exercise counseling extensively discussed, handout provided  ? ?Essential hypertension ?-BP 150/95, on repeat 156/101, not at goal of 140/90 or lower ?-started losartan 25 mg given renoprotective effects with DM diagnosis ?-follow up in 1 week for BP check and BMP  ?-strict ED precautions discussed  ?  ? ?-Med rec reviewed and updated appropriately. ?-PHQ-9 score of 0  reviewed.  ? ?Raymondo Band, DO ?[facility]  ?

## 2021-04-11 NOTE — Patient Instructions (Addendum)
It was great seeing you today! ? ?Today we discussed your diabetes. Please continue to take metformin twice daily as prescribed. Congratulations on all the healthy changes you have been making! This will help you be more healthy. Please continue to stay physically active and exercise at least 3 times a week. Try to eat a more balanced diet as we discussed. ? ?I will discuss the issues with trulicity with the pharmacist and get back in contact with you to determine a better option that is covered by your insurance.  ? ?We will check another A1c in 2 months.  ? ?Your blood pressure was high so I have started losartan 25 mg, please pick this up and take it daily starting today.  ? ?Please follow up at your next scheduled appointment in 1 week, if anything arises between now and then, please don't hesitate to contact our office. ? ? ?Thank you for allowing [place] to be a part of your medical care! ? ?Thank you, ?Dr. Larae Grooms  ?

## 2021-04-18 ENCOUNTER — Other Ambulatory Visit: Payer: Self-pay

## 2021-04-18 ENCOUNTER — Encounter: Payer: Self-pay | Admitting: *Deleted

## 2021-04-18 ENCOUNTER — Ambulatory Visit (INDEPENDENT_AMBULATORY_CARE_PROVIDER_SITE_OTHER): Payer: PRIVATE HEALTH INSURANCE | Admitting: Family Medicine

## 2021-04-18 ENCOUNTER — Encounter: Payer: Self-pay | Admitting: Family Medicine

## 2021-04-18 VITALS — BP 144/78 | HR 66 | Ht 74.0 in | Wt 384.0 lb

## 2021-04-18 DIAGNOSIS — I10 Essential (primary) hypertension: Secondary | ICD-10-CM

## 2021-04-18 NOTE — Assessment & Plan Note (Addendum)
-  BP 161/99, repeat 144/78, near goal of <140/90 ?-BMP obtained, awaiting results  ?-continue losartan 25 mg daily ?-instructed to maintain BP log and bring to next visit, guidelines discussed  ?-follow up at next visit  ?

## 2021-04-18 NOTE — Progress Notes (Signed)
? ? ?  SUBJECTIVE:  ? ?CHIEF COMPLAINT / HPI:  ? ?Patient with history of type 2 DM and hypertension presents for follow up. Recently started on losartan 25 mg and here for BP check. He has not been able to check his blood pressures at home. Denies chest pain, dyspnea, leg swelling, vision changes and weakness. Eating healthy and still going to the gym. Compliant on losartan 25 mg every night, tolerating medication well.  ? ?Compliant on metformin but has not started ozempic as he has not been able to pick it up since his insurance also will not cover for this. Ozempic was recently changed from Trulicity at the last visit as this was not covered by his insurance as well.  ? ?OBJECTIVE:  ? ?BP (!) 144/78   Pulse 66   Ht 6\' 2"  (1.88 m)   Wt (!) 384 lb (174.2 kg)   BMI 49.30 kg/m?   ?General: Patient well-appearing, in no acute distress. ?CV: RRR, no murmurs or gallops auscultated ?Resp: CTAB, no wheezing, rales or rhonchi noted ?Ext: no LE edema noted bilaterally  ?Psych: mood appropriate  ? ?ASSESSMENT/PLAN:  ? ?Essential hypertension ?-BP 161/99, repeat 144/78, near goal of <140/90 ?-BMP obtained, awaiting results  ?-continue losartan 25 mg daily ?-instructed to maintain BP log and bring to next visit, guidelines discussed  ?-follow up at next visit  ? ?Diabetes mellitus (HCC) ?-last A1c 11.8 ?-continue metformin ?-up to date on foot exam and annual ophthalmology exam  ?-referred to Dr. to determine regimen that is covered by insurance ?-diet and exercise counseling ?-follow up with PCP in 2 months for next A1c check  ?  ? ? ?Raymondo Band, DO ?[facility]  ?

## 2021-04-18 NOTE — Patient Instructions (Addendum)
It was great seeing you today! ? ?Today we discussed your blood pressure and diabetes. Your blood pressure was near goal, please continue the losartan. I would encourage you to check your blood pressure at least once daily and bring in this record at your next visit. Make sure to be sitting in a chair back to the seat, feet flat on the floor for at least 5-10 minutes before checking. Make sure to rest your arm with the cuff as well.  ? ?Please continue to eat healthy and stay physically active. We will get blood work today, I will let you know of any abnormal results.  ? ?Please follow up with Dr. Raymondo Band for your diabetes at your earliest convenience, we want to find an appropriate regimen for you. We will recheck another A1c in 2 months. ? ?Please follow up at your next scheduled appointment in 2 months, if anything arises between now and then, please don't hesitate to contact our office. ? ? ?Thank you for allowing [place] to be a part of your medical care! ? ?Thank you, ?Dr. Robyne Peers  ?

## 2021-04-18 NOTE — Assessment & Plan Note (Signed)
-  last A1c 11.8 ?-continue metformin ?-up to date on foot exam and annual ophthalmology exam  ?-referred to Dr. Valentina Lucks to determine regimen that is covered by insurance ?-diet and exercise counseling ?-follow up with PCP in 2 months for next A1c check  ?

## 2021-04-18 NOTE — Telephone Encounter (Signed)
ATC x2.  No answer, LVM to return call.  Unable to reach letter sent. ?

## 2021-04-19 LAB — BASIC METABOLIC PANEL
BUN/Creatinine Ratio: 8 — ABNORMAL LOW (ref 9–20)
BUN: 7 mg/dL (ref 6–24)
CO2: 23 mmol/L (ref 20–29)
Calcium: 9.5 mg/dL (ref 8.7–10.2)
Chloride: 97 mmol/L (ref 96–106)
Creatinine, Ser: 0.91 mg/dL (ref 0.76–1.27)
Glucose: 344 mg/dL — ABNORMAL HIGH (ref 70–99)
Potassium: 4.5 mmol/L (ref 3.5–5.2)
Sodium: 135 mmol/L (ref 134–144)
eGFR: 107 mL/min/{1.73_m2} (ref >59)

## 2021-04-22 NOTE — Telephone Encounter (Signed)
Patient is aware of results and voiced his understanding.  ?Appt scheduled 04/28/2021 at 11:00. ?Nothing further needed.  ? ?

## 2021-04-28 ENCOUNTER — Ambulatory Visit: Payer: PRIVATE HEALTH INSURANCE | Admitting: Adult Health

## 2021-04-29 ENCOUNTER — Ambulatory Visit: Payer: PRIVATE HEALTH INSURANCE | Admitting: Pharmacist

## 2021-05-05 ENCOUNTER — Ambulatory Visit: Payer: PRIVATE HEALTH INSURANCE | Admitting: Nurse Practitioner

## 2021-05-05 NOTE — Progress Notes (Deleted)
? ?@Patient  ID: Anthony Collier , male    DOB: 1975/03/10, 46 y.o.   MRN: [id number] ? ?No chief complaint on file. ? ? ?Referring provider: ?[id number], DO ? ?HPI: ?46 year old male, never smoker followed for OSA. He is a patient of Dr. 55 and last seen in office on 03/27/2021 by Parrett,NP for sleep consult. Past medical history significant for DM II, morbid obesity, and HTN.  ? ?TEST/EVENTS:  ?04/07/2021 HST: AHI 38.4/hr, SpO2 low 71%. Test showed severe obstructive sleep apnea ? ? ? ?No Known Allergies ? ?Immunization History  ?Administered Date(s) Administered  ? Influenza,inj,Quad PF,6+ Mos 10/23/2016, 03/02/2018, 03/14/2020, 01/13/2021  ? Influenza-Unspecified 10/25/2016  ? PFIZER Comirnaty(Gray Top)Covid-19 Tri-Sucrose Vaccine 07/30/2020  ? PFIZER(Purple Top)SARS-COV-2 Vaccination 08/23/2019, 09/13/2019  ? Pneumococcal Polysaccharide-23 06/11/2017  ? Tdap 07/05/2012  ? ? ?Past Medical History:  ?Diagnosis Date  ? Diabetes mellitus without complication (HCC)   ? Hypertension   ? ? ?Tobacco History: ?Social History  ? ?Tobacco Use  ?Smoking Status Never  ?Smokeless Tobacco Never  ? ?Counseling given: Not Answered ? ? ?Outpatient Medications Prior to Visit  ?Medication Sig Dispense Refill  ? atorvastatin (LIPITOR) 10 MG tablet TAKE 1 TABLET(10 MG) BY MOUTH DAILY 90 tablet 0  ? losartan (COZAAR) 25 MG tablet Take 1 tablet (25 mg total) by mouth at bedtime. 90 tablet 3  ? metFORMIN (GLUCOPHAGE-XR) 500 MG 24 hr tablet Take 2 tablets (1,000 mg total) by mouth in the morning and at bedtime. 120 tablet 6  ? Semaglutide,0.25 or 0.5MG /DOS, 2 MG/1.5ML SOPN Inject 0.25 mg into the skin once a week. 0.25 mg once weekly for 4 weeks then increase to 0.5 mg weekly for at least 4 weeks,max 1 mg 3 mL 1  ? ?No facility-administered medications prior to visit.  ? ? ? ?Review of Systems:  ? ?Constitutional: No weight loss or gain, night sweats, fevers, chills, fatigue, or lassitude. ?HEENT: No headaches, difficulty swallowing,  tooth/dental problems, or sore throat. No sneezing, itching, ear ache, nasal congestion, or post nasal drip ?CV:  No chest pain, orthopnea, PND, swelling in lower extremities, anasarca, dizziness, palpitations, syncope ?Resp: No shortness of breath with exertion or at rest. No excess mucus or change in color of mucus. No productive or non-productive. No hemoptysis. No wheezing.  No chest wall deformity ?GI:  No heartburn, indigestion, abdominal pain, nausea, vomiting, diarrhea, change in bowel habits, loss of appetite, bloody stools.  ?GU: No dysuria, change in color of urine, urgency or frequency.  No flank pain, no hematuria  ?Skin: No rash, lesions, ulcerations ?MSK:  No joint pain or swelling.  No decreased range of motion.  No back pain. ?Neuro: No dizziness or lightheadedness.  ?Psych: No depression or anxiety. Mood stable.  ? ? ? ?Physical Exam: ? ?There were no vitals taken for this visit. ? ?GEN: Pleasant, interactive, well-nourished/chronically-ill appearing/acutely-ill appearing/poorly-nourished/morbidly obese; in no acute distress.****** ?HEENT:  Normocephalic and atraumatic. EACs patent bilaterally. TM pearly gray with present light reflex bilaterally. PERRLA. Sclera white. Nasal turbinates pink, moist and patent bilaterally. No rhinorrhea present. Oropharynx pink and moist, without exudate or edema. No lesions, ulcerations, or postnasal drip.  ?NECK:  Supple w/ fair ROM. No JVD present. Normal carotid impulses w/o bruits. Thyroid symmetrical with no goiter or nodules palpated. No lymphadenopathy.   ?CV: RRR, no m/r/g, no peripheral edema. Pulses intact, +2 bilaterally. No cyanosis, pallor or clubbing. ?PULMONARY:  Unlabored, regular breathing. Clear bilaterally A&P w/o wheezes/rales/rhonchi. No accessory muscle use. No  dullness to percussion. ?GI: BS present and normoactive. Soft, non-tender to palpation. No organomegaly or masses detected. No CVA tenderness. ?MSK: No erythema, warmth or tenderness.  Cap refil <2 sec all extrem. No deformities or joint swelling noted.  ?Neuro: A/Ox3. No focal deficits noted.   ?Skin: Warm, no lesions or rashe ?Psych: Normal affect and behavior. Judgement and thought content appropriate.  ? ? ? ?Lab Results: ? ?CBC ?   ?Component Value Date/Time  ? WBC 6.3 03/14/2020 1436  ? WBC 6.8 04/21/2017 1830  ? RBC 4.75 03/14/2020 1436  ? RBC 4.69 04/21/2017 1830  ? HGB 12.7 (L) 03/14/2020 1436  ? HCT 40.6 03/14/2020 1436  ? PLT 264 03/14/2020 1436  ? MCV 86 03/14/2020 1436  ? MCH 26.7 03/14/2020 1436  ? MCH 26.9 04/21/2017 1830  ? MCHC 31.3 (L) 03/14/2020 1436  ? MCHC 31.3 04/21/2017 1830  ? RDW 13.6 03/14/2020 1436  ? LYMPHSABS 1.8 03/14/2020 1436  ? EOSABS 0.1 03/14/2020 1436  ? BASOSABS 0.0 03/14/2020 1436  ? ? ?BMET ?   ?Component Value Date/Time  ? NA 135 04/18/2021 0947  ? K 4.5 04/18/2021 0947  ? CL 97 04/18/2021 0947  ? CO2 23 04/18/2021 0947  ? GLUCOSE 344 (H) 04/18/2021 0947  ? GLUCOSE 96 04/21/2017 1830  ? BUN 7 04/18/2021 0947  ? CREATININE 0.91 04/18/2021 0947  ? CALCIUM 9.5 04/18/2021 0947  ? GFRNONAA 105 03/14/2020 1436  ? GFRAA 121 03/14/2020 1436  ? ? ?BNP ?No results found for: BNP ? ? ?Imaging: ? ?No results found. ? ? ? ?   ? View : No data to display.  ?  ?  ?  ? ? ?No results found for: NITRICOXIDE ? ? ? ? ? ?Assessment & Plan:  ? ?No problem-specific Assessment & Plan notes found for this encounter. ? ? ?I spent *** minutes of dedicated to the care of this patient on the date of this encounter to include pre-visit review of records, face-to-face time with the patient discussing conditions above, post visit ordering of testing, clinical documentation with the electronic health record, making appropriate referrals as documented, and communicating necessary findings to members of the patients care team. ? ?Noemi Chapel, NP ?05/05/2021 ? ?Pt aware and understands NP's role.  ? ?

## 2021-05-07 ENCOUNTER — Ambulatory Visit (INDEPENDENT_AMBULATORY_CARE_PROVIDER_SITE_OTHER): Payer: PRIVATE HEALTH INSURANCE | Admitting: Nurse Practitioner

## 2021-05-07 ENCOUNTER — Encounter: Payer: Self-pay | Admitting: Nurse Practitioner

## 2021-05-07 VITALS — BP 118/70 | HR 68 | Ht 74.0 in | Wt 376.8 lb

## 2021-05-07 DIAGNOSIS — G4733 Obstructive sleep apnea (adult) (pediatric): Secondary | ICD-10-CM | POA: Diagnosis not present

## 2021-05-07 DIAGNOSIS — E119 Type 2 diabetes mellitus without complications: Secondary | ICD-10-CM | POA: Diagnosis not present

## 2021-05-07 DIAGNOSIS — G4719 Other hypersomnia: Secondary | ICD-10-CM | POA: Diagnosis not present

## 2021-05-07 NOTE — Patient Instructions (Addendum)
Start CPAP auto 5-20 cmH2O with nasal pillow mask, minimum of 4-6 hours a night.  ?Change equipment every 30 days or as directed by DME. Wash your tubing with warm soap and water daily, hang to dry. Wash humidifier portion weekly.  ?Be aware of reduced alertness and do not drive or operate heavy machinery if experiencing this or drowsiness.  ?Healthy weight management discussed.  ?Avoid or decrease alcohol consumption and medications that make you more sleepy, if possible. ?Notify if persistent daytime sleepiness occurs even with consistent use of CPAP. ? ?Follow up in 3 months with Dr. Craige Cotta or Philis Nettle. If you have not been on your CPAP for at least 31 days, please call to reschedule for an appointment within 31-90 days of starting therapy. If symptoms do not improve or worsen, please contact office for sooner follow up or seek emergency care. ?

## 2021-05-07 NOTE — Assessment & Plan Note (Signed)
New diagnosis of severe OSA. We discussed how untreated sleep apnea puts an individual at risk for cardiac arrhthymias, pulm HTN, DM, stroke and increases their risk for daytime accidents. We also briefly reviewed treatment options including weight loss, side sleeping position, oral appliance, CPAP therapy or referral to ENT for possible surgical options. Discussed that CPAP is preferred treatment for severe OSA and that he would not be a surgical candidate with current BMI. Pt will to start CPAP; preferred nasal pillow mask upon mask review. New start sent to DME.  ? ?Patient Instructions  ?Start CPAP auto 5-20 cmH2O with nasal pillow mask, minimum of 4-6 hours a night.  ?Change equipment every 30 days or as directed by DME. Wash your tubing with warm soap and water daily, hang to dry. Wash humidifier portion weekly.  ?Be aware of reduced alertness and do not drive or operate heavy machinery if experiencing this or drowsiness.  ?Healthy weight management discussed.  ?Avoid or decrease alcohol consumption and medications that make you more sleepy, if possible. ?Notify if persistent daytime sleepiness occurs even with consistent use of CPAP. ? ?Follow up in 3 months with Dr. Craige Cotta or Philis Nettle. If you have not been on your CPAP for at least 31 days, please call to reschedule for an appointment within 31-90 days of starting therapy. If symptoms do not improve or worsen, please contact office for sooner follow up or seek emergency care. ? ? ?

## 2021-05-07 NOTE — Assessment & Plan Note (Addendum)
Healthy weight management discussed. Actively working on weight loss. Down another 10 lb since last visit here.  ?

## 2021-05-07 NOTE — Assessment & Plan Note (Signed)
Currently working with PCP and recently underwent some medication changes that have cause frequent urination at night. Discussed this should stabilize but to notify if PCP if problems persist. Follow up with PCP as scheduled or PRN. ?

## 2021-05-07 NOTE — Progress Notes (Signed)
? ?@Patient  ID: Anthony Collier , male    DOB: Jan 07, 1976, 46 y.o.   MRN: [id number] ? ?Chief Complaint  ?Patient presents with  ? Follow-up  ?  Sleep study results  ? ? ?Referring provider: ?[id number], DO ? ?HPI: ?46 year old male, never smoker followed for severe obstructive sleep apnea.  He is a patient Dr. 55 and last seen in office on 03/27/2021 for sleep consult by Parrett NP.  Past medical history significant for diabetes, morbid obesity, hypertension. ? ?TEST/EVENTS:  ?04/07/2021 HST: AHI 38.4/h, SPO2 low of 71%.  Severe obstructive sleep apnea ? ?03/27/2021: OV with Parrett, NP for sleep consult concerning daytime fatigue, snoring and restless sleep.  Epworth score was 1.  Reported that he is working on weight loss and is down about 40 pounds over the last 2 years.  BMI was 49.  HST ordered.  Healthy weight management advised. ? ?05/07/2021: Today-follow-up ?Patient presents today with wife to discuss sleep study results.  He had HST performed on 04/07/2021 which showed severe obstructive sleep apnea with desaturation low of 71%.  He continues to snore at night and have some occasional fatigue symptoms.  He has started having issues with waking up frequently to use the restroom.  He is working with his PCP on changes to his diabetes regimen and better control of his blood sugars, which is when this started.  He is only getting about 2-hour stretches at night.  Sleeps for about 4 to 5 hours a night.  His normal sleep regimen involves going to bed around 10:30 PM and waking around 8 AM.  Only takes about 10 minutes to go to sleep at night usually does not wake up throughout the night.  Suspect this is probably related to medication changes and should plateau once sugars have stabilized. He denies morning headaches or drowsy driving. No history of narcolepsy or cataplexy.  ? ?No Known Allergies ? ?Immunization History  ?Administered Date(s) Administered  ? Influenza,inj,Quad PF,6+ Mos 10/23/2016, 03/02/2018,  03/14/2020, 01/13/2021  ? Influenza-Unspecified 10/25/2016  ? PFIZER Comirnaty(Gray Top)Covid-19 Tri-Sucrose Vaccine 07/30/2020  ? PFIZER(Purple Top)SARS-COV-2 Vaccination 08/23/2019, 09/13/2019  ? Pneumococcal Polysaccharide-23 06/11/2017  ? Tdap 07/05/2012  ? ? ?Past Medical History:  ?Diagnosis Date  ? Diabetes mellitus without complication (HCC)   ? Hypertension   ? ? ?Tobacco History: ?Social History  ? ?Tobacco Use  ?Smoking Status Never  ?Smokeless Tobacco Never  ? ?Counseling given: Not Answered ? ? ?Outpatient Medications Prior to Visit  ?Medication Sig Dispense Refill  ? atorvastatin (LIPITOR) 10 MG tablet TAKE 1 TABLET(10 MG) BY MOUTH DAILY 90 tablet 0  ? losartan (COZAAR) 25 MG tablet Take 1 tablet (25 mg total) by mouth at bedtime. 90 tablet 3  ? metFORMIN (GLUCOPHAGE-XR) 500 MG 24 hr tablet Take 2 tablets (1,000 mg total) by mouth in the morning and at bedtime. 120 tablet 6  ? Semaglutide,0.25 or 0.5MG /DOS, 2 MG/1.5ML SOPN Inject 0.25 mg into the skin once a week. 0.25 mg once weekly for 4 weeks then increase to 0.5 mg weekly for at least 4 weeks,max 1 mg (Patient not taking: Reported on 05/07/2021) 3 mL 1  ? ?No facility-administered medications prior to visit.  ? ? ? ?Review of Systems:  ? ?Constitutional: No weight loss or gain, night sweats, fevers, chills. +occasional fatigue ?HEENT: No headaches, difficulty swallowing, tooth/dental problems, or sore throat. No sneezing, itching, ear ache, nasal congestion, or post nasal drip. +snoring ?CV:  No chest pain, orthopnea, PND, swelling in lower  extremities, anasarca, dizziness, palpitations, syncope ?Resp: No shortness of breath with exertion or at rest. No excess mucus or change in color of mucus. No productive or non-productive. No hemoptysis. No wheezing.  No chest wall deformity ?GU: +frequent urination at night with recent medication changes. No dysuria, change in color of urine, urgency.  No flank pain, no hematuria  ?Skin: No rash, lesions,  ulcerations ?Neuro: No dizziness or lightheadedness.  ?Psych: No depression or anxiety. Mood stable.  ? ? ? ?Physical Exam: ? ?BP 118/70 (BP Location: Left Arm, Cuff Size: Large)   Pulse 68   Ht 6\' 2"  (1.88 m)   Wt (!) 376 lb 12.8 oz (170.9 kg)   SpO2 96%   BMI 48.38 kg/m?  ? ?GEN: Pleasant, interactive, well-appearing; morbidly obese; in no acute distress. ?HEENT:  Normocephalic and atraumatic. PERRLA. Sclera white. Nasal turbinates pink, moist and patent bilaterally. No rhinorrhea present. Oropharynx pink and moist, without exudate or edema. No lesions, ulcerations, or postnasal drip.  ?NECK:  Supple w/ fair ROM. No JVD present. Normal carotid impulses w/o bruits. Thyroid symmetrical with no goiter or nodules palpated. No lymphadenopathy.   ?CV: RRR, no m/r/g, no peripheral edema. Pulses intact, +2 bilaterally. No cyanosis, pallor or clubbing. ?PULMONARY:  Unlabored, regular breathing. Clear bilaterally A&P w/o wheezes/rales/rhonchi. No accessory muscle use. No dullness to percussion. ?GI: BS present and normoactive. Soft, non-tender to palpation. No organomegaly or masses detected. No CVA tenderness. ?Neuro: A/Ox3. No focal deficits noted.   ?Skin: Warm, no lesions or rashe ?Psych: Normal affect and behavior. Judgement and thought content appropriate.  ? ? ? ?Lab Results: ? ?CBC ?   ?Component Value Date/Time  ? WBC 6.3 03/14/2020 1436  ? WBC 6.8 04/21/2017 1830  ? RBC 4.75 03/14/2020 1436  ? RBC 4.69 04/21/2017 1830  ? HGB 12.7 (L) 03/14/2020 1436  ? HCT 40.6 03/14/2020 1436  ? PLT 264 03/14/2020 1436  ? MCV 86 03/14/2020 1436  ? MCH 26.7 03/14/2020 1436  ? MCH 26.9 04/21/2017 1830  ? MCHC 31.3 (L) 03/14/2020 1436  ? MCHC 31.3 04/21/2017 1830  ? RDW 13.6 03/14/2020 1436  ? LYMPHSABS 1.8 03/14/2020 1436  ? EOSABS 0.1 03/14/2020 1436  ? BASOSABS 0.0 03/14/2020 1436  ? ? ?BMET ?   ?Component Value Date/Time  ? NA 135 04/18/2021 0947  ? K 4.5 04/18/2021 0947  ? CL 97 04/18/2021 0947  ? CO2 23 04/18/2021 0947  ?  GLUCOSE 344 (H) 04/18/2021 0947  ? GLUCOSE 96 04/21/2017 1830  ? BUN 7 04/18/2021 0947  ? CREATININE 0.91 04/18/2021 0947  ? CALCIUM 9.5 04/18/2021 0947  ? GFRNONAA 105 03/14/2020 1436  ? GFRAA 121 03/14/2020 1436  ? ? ?BNP ?No results found for: BNP ? ? ?Imaging: ? ?No results found. ? ? ? ?   ? View : No data to display.  ?  ?  ?  ? ? ?No results found for: NITRICOXIDE ? ? ? ? ? ?Assessment & Plan:  ? ?Severe obstructive sleep apnea ?New diagnosis of severe OSA. We discussed how untreated sleep apnea puts an individual at risk for cardiac arrhthymias, pulm HTN, DM, stroke and increases their risk for daytime accidents. We also briefly reviewed treatment options including weight loss, side sleeping position, oral appliance, CPAP therapy or referral to ENT for possible surgical options. Discussed that CPAP is preferred treatment for severe OSA and that he would not be a surgical candidate with current BMI. Pt will to start CPAP; preferred  nasal pillow mask upon mask review. New start sent to DME.  ? ?Patient Instructions  ?Start CPAP auto 5-20 cmH2O with nasal pillow mask, minimum of 4-6 hours a night.  ?Change equipment every 30 days or as directed by DME. Wash your tubing with warm soap and water daily, hang to dry. Wash humidifier portion weekly.  ?Be aware of reduced alertness and do not drive or operate heavy machinery if experiencing this or drowsiness.  ?Healthy weight management discussed.  ?Avoid or decrease alcohol consumption and medications that make you more sleepy, if possible. ?Notify if persistent daytime sleepiness occurs even with consistent use of CPAP. ? ?Follow up in 3 months with Dr. Craige CottaSood or Philis NettleKatie Kyser Wandel,NP. If you have not been on your CPAP for at least 31 days, please call to reschedule for an appointment within 31-90 days of starting therapy. If symptoms do not improve or worsen, please contact office for sooner follow up or seek emergency care. ? ? ? ?Diabetes mellitus (HCC) ?Currently  working with PCP and recently underwent some medication changes that have cause frequent urination at night. Discussed this should stabilize but to notify if PCP if problems persist. Follow up with PCP as scheduled o

## 2021-05-08 NOTE — Progress Notes (Signed)
Reviewed and agree with assessment/plan. ? ? ?Lior Cartelli, MD ?[facility] ?05/08/2021, 11:15 AM ?Pager:  [phone] ? ?

## 2021-05-15 ENCOUNTER — Telehealth: Payer: Self-pay | Admitting: Nurse Practitioner

## 2021-05-15 NOTE — Telephone Encounter (Signed)
Patient called in to check the status of his cpap. He states it's been 10 days and has not gotten a call about it yet. Patient states his job told him that if he does not get the machine by 5/1 he will be terminated. Patient needs a call back asap [phone] ?

## 2021-05-15 NOTE — Telephone Encounter (Signed)
The patient called back and he is aware that we are waiting in authorization and he would get a call from the medical supply company within the next week. Nothing further needed.  ?

## 2021-05-15 NOTE — Telephone Encounter (Signed)
Called [employer] and spoke with Melissa to see if she could provide [place] an update on when pt might be able to receive CPAP machine as I stated to her that pt said if he did not receive machine by 5/1, his job would be terminated. Melissa stated that with pt having medicaid, a PA needs to be done and this is where the hold up is. ? ?CMN form has been sent to pt's insurance and they are waiting to hear back from them about this and also about having the PA done. She said she would send an urgent message to the person who is handling pt's PA to let them know this to see if they could rush the PA along. ? ? ?Attempted to call pt to let them know this info but unable to reach. Left message for pt to return call. ?

## 2021-05-21 NOTE — Telephone Encounter (Signed)
Received a fax from [employer] that the patient has not responsed to get set up for CPAP and I have left a message with their number for him to call. He was asked to call the office with any questions. Mychart message sent as well.  ?

## 2021-06-02 ENCOUNTER — Other Ambulatory Visit (HOSPITAL_COMMUNITY): Payer: Self-pay

## 2021-06-02 ENCOUNTER — Ambulatory Visit (INDEPENDENT_AMBULATORY_CARE_PROVIDER_SITE_OTHER): Payer: PRIVATE HEALTH INSURANCE | Admitting: Pharmacist

## 2021-06-02 ENCOUNTER — Encounter: Payer: Self-pay | Admitting: Pharmacist

## 2021-06-02 ENCOUNTER — Other Ambulatory Visit: Payer: Self-pay

## 2021-06-02 DIAGNOSIS — E119 Type 2 diabetes mellitus without complications: Secondary | ICD-10-CM

## 2021-06-02 MED ORDER — ACCU-CHEK GUIDE VI STRP: ORAL_STRIP | 12 refills | Status: DC

## 2021-06-02 MED ORDER — ACCU-CHEK SOFTCLIX LANCETS MISC: 12 refills | Status: DC

## 2021-06-02 MED ORDER — ACCU-CHEK SOFTCLIX LANCET DEV KIT: PACK | 0 refills | Status: AC

## 2021-06-02 MED ORDER — ACCU-CHEK GUIDE ME W/DEVICE KIT: PACK | 0 refills | Status: AC

## 2021-06-02 NOTE — Progress Notes (Signed)
Reviewed: I agree with Dr. Koval's documentation and management. 

## 2021-06-02 NOTE — Progress Notes (Signed)
? ? ?S:    ? ?Chief Complaint  ?Patient presents with  ? Medication Management  ?  diabetes  ? ?Anthony Collier is a 46 y.o. male who presents for diabetes evaluation, education, and management. PMH is significant for obesity and worsening diabetes control over the last year. . Patient was referred and last seen by Primary Care Provider, Dr. Robyne Peers, on 04/18/2021.  ? ?Today, patient arrives in good spirits and presents without assistance. He is accompanied by hiswife.  ? ?Patient reports Diabetes was diagnosed in 2019.  ? ?Current diabetes medications include: Metformin 1000mg  (2x500mg  XR) twice daily.  ?Current hypertension medications include: losartan 25mg  ?Current hyperlipidemia medications include: atorvastatin 10mg  ? ?GLP therapy is too expensive with current insurance (test claim $900).  ?Insurance is currently through the market place:  [employer] ?Likely high deductible plan.   ?Patient is currently unemployed and is receiving unemployment.  He  ? ?Patient reports taking all medications as prescribed. Patient reports adherence with medications.  ? ?Do you feel that your medications are working for you? Yes. But cannot afford the next therapy (GLP in any form). ?Have you been experiencing any side effects to the medications prescribed? no ?Do you have any problems obtaining medications due to transportation or finances? yes ? ?Patient denies hypoglycemic events. ? ?Reported home fasting blood sugars: NOT testing nor has he ever tested.  ? ?Patient reports nocturia (nighttime urination). 1-3 times per night.  Increased from 0-1 per night 3 years ago.  ?Patient denies neuropathy (nerve pain). ?Patient reports self foot exams.  ? ?Drinks: Water primarily - minimal sugar containing liquids.  ? ?Patient-reported exercise habits: 3 days per week - going to the gym for 90 minutes.  ?Does both bike and treadmill in addition to lifting weights.  ? ? ?O:  ?Physical Exam ?Constitutional:   ?    Appearance: Normal appearance. He is obese.  ?Pulmonary:  ?   Effort: Pulmonary effort is normal.  ?Neurological:  ?   Mental Status: He is alert.  ?Psychiatric:     ?   Mood and Affect: Mood normal.     ?   Behavior: Behavior normal.     ?   Thought Content: Thought content normal.  ? ? ?Review of Systems  ?All other systems reviewed and are negative. ? ?Lab Results  ?Component Value Date  ? HGBA1C 11.8 (A) 03/07/2021  ? ?Vitals:  ? 06/02/21 0923  ?BP: 126/83  ?Pulse: 69  ?SpO2: 96%  ? ? ?Lipid Panel  ?   ?Component Value Date/Time  ? CHOL 118 03/13/2021 1633  ? TRIG 101 03/13/2021 1633  ? HDL 39 (L) 03/13/2021 1633  ? CHOLHDL 3.0 03/13/2021 1633  ? LDLCALC 60 03/13/2021 1633  ? ? ?A/P: ?Diabetes longstanding for ~ 4 years and currently with worsened control due to dietary indiscretion and immobility. Medication adherence appears good with metformin. Control is suboptimal due to inability to obtain GLP therapy through insurance.  ?-Continued to investigate option for ways to obtain GLP-1 Ozempic or Trulicity, however high deductible insurance is cost prohibitive for this patient who is currently out of work.  ?-Continued metformin 1000mg  BID.  ?-Extensively discussed pathophysiology of diabetes, recommended lifestyle interventions, dietary effects on blood sugar control.  ?Patient willing and interested in learning more about blood sugar control by using a glucometer.  ?New glucometer and supplies ordered.   ? ?Hypertension at goal with use of losartan 25mg . Blood pressure goal of <130/80 mmHg. Medication  adherence good.  ?-Continue losartan 25mg  daily.  ? ? ?Written patient instructions provided. Patient verbalized understanding of treatment plan. Total time in face to face counseling 25 minutes.  Happy to meet with this patient again in the future to discuss details on the use of his glucometer or to instruct on GLP injection therapy.  ? ?Follow up pharmacist PRN.  Follow-up PCP clinic visit in 1 month. ?

## 2021-06-02 NOTE — Patient Instructions (Signed)
Nice to meet you today.   ? ?I will send in a meter with supplies.  Please check once a day at various times to give you more information on your blood sugar control.  ? ? ?

## 2021-06-02 NOTE — Assessment & Plan Note (Signed)
Diabetes longstanding for ~ 4 years and currently with worsened control due to dietary indiscretion and immobility. Medication adherence appears good with metformin. Control is suboptimal due to inability to obtain GLP therapy through insurance.  ?-Continued to investigate option for ways to obtain GLP-1 Ozempic or Trulicity, however high deductible insurance is cost prohibitive for this patient who is currently out of work.  ?-Continued metformin 1000mg  BID.  ?-Extensively discussed pathophysiology of diabetes, recommended lifestyle interventions, dietary effects on blood sugar control.  ?Patient willing and interested in learning more about blood sugar control by using a glucometer.  ?New glucometer and supplies ordered.   ?

## 2021-06-25 ENCOUNTER — Ambulatory Visit (INDEPENDENT_AMBULATORY_CARE_PROVIDER_SITE_OTHER): Payer: PRIVATE HEALTH INSURANCE | Admitting: Family Medicine

## 2021-06-25 ENCOUNTER — Encounter: Payer: Self-pay | Admitting: Family Medicine

## 2021-06-25 VITALS — BP 116/85 | HR 79 | Ht 74.0 in | Wt 367.6 lb

## 2021-06-25 DIAGNOSIS — E119 Type 2 diabetes mellitus without complications: Secondary | ICD-10-CM

## 2021-06-25 LAB — POCT GLYCOSYLATED HEMOGLOBIN (HGB A1C): HbA1c POC (<> result, manual entry): >15 % (ref 4.0–5.6)

## 2021-06-25 MED ORDER — INSULIN DEGLUDEC 100 UNIT/ML ~~LOC~~ SOPN: 10.0000 [IU] | PEN_INJECTOR | Freq: Every day | SUBCUTANEOUS | 1 refills | Status: DC

## 2021-06-25 NOTE — Assessment & Plan Note (Addendum)
-  A1c >15, insurance has been a hindrance to patient being able to maintain appropriate glycemic control as semaglutide is not covered. Started tresiba, samples provided. Extensive education provided on tresiba administration. Instructed to start at 10 units today, check blood glucose levels daily and add 2 additional units for each day that glucose level is above 150. Patient and spouse report understanding.  -continue metformin 1000 mg bid  -Extensive discussion on diet and exercise counseling provided, encouraged to meal prep and incorporate more vegetables into diet while limiting fatty foods. Encouraged to continue limiting sugar intake. -follow up with Dr. Myrtis Ser. 6/15 -follow up in 1 week to monitor progression, patient will need more insulin in addition to sample provided today by next appointment time. Working with Durward Mallard and pharmacy team to determine best way for patient to be able to qualify for insulin.

## 2021-06-25 NOTE — Progress Notes (Cosign Needed Addendum)
    SUBJECTIVE:   CHIEF COMPLAINT / HPI:   Patient presents for DM follow up. Glucose levels at home have been ranging around 200s. Saw Dr. Valentina Lucks earlier in the month who recommended to check glucose levels and make adjustments accordingly. Regimen includes metformin 1000 mg bid which he is strictly compliant. He has not been able semaglutide due to lack of insurance coverage, he is planning on changing but is currently unemployed so working on Insurance underwriter. He has not been to the gym in awhile but plans on soon. He has been doing a lot of walking for exercise. Typical diet has not changed, he eats fast food often and less meals but he has been trying to choose healthy options at fast food restaurants as well. He has made changes in that he hardly eats sweets and limits soda intake, if he does have a soda he drinks sugar-less.   OBJECTIVE:   BP 116/85   Pulse 79   Ht 6\' 2"  (1.88 m)   Wt (!) 367 lb 9.6 oz (166.7 kg)   SpO2 98%   BMI 47.20 kg/m   General: Patient well-appearing, in no acute distress. CV: RRR, no murmurs or gallops auscultated. Resp: CTAB, no wheezing, rales or rhonchi noted Ext: no LE edema noted bilaterally  ASSESSMENT/PLAN:   Diabetes mellitus (HCC) -A1c >15, insurance has been a hindrance to patient being able to maintain appropriate glycemic control as semaglutide is not covered. Started tresiba, samples provided. Extensive education provided on tresiba administration. Instructed to start at 10 units today, check blood glucose levels daily and add 2 additional units for each day that glucose level is above 150. Patient and spouse report understanding.  -continue metformin 1000 mg bid  -Extensive discussion on diet and exercise counseling provided, encouraged to meal prep and incorporate more vegetables into diet while limiting fatty foods. Encouraged to continue limiting sugar intake. -follow up with Dr. Pollyann Savoy. 6/15 -follow up in 1 week to monitor progression, patient will  need more insulin in addition to sample provided today by next appointment time. Working with Rosendo Gros and pharmacy team to determine best way for patient to be able to qualify for insulin.     -PHQ-9 score of 0 reviewed.   Donney Dice, [place]

## 2021-06-25 NOTE — Patient Instructions (Addendum)
It was great seeing you today!  Today we discussed your diabetes, your A1c is greater than 15. Please continue to take metformin 1000 twice daily. We will also start insulin. Please take 10 units of tresiba daily and check your sugar level, if it is above 150 then please add 2 units. Do this daily and continue to add 2 units as needed.   Please follow up at your next scheduled appointment in 1 WEEK, if anything arises between now and then, please don't hesitate to contact our office.  You will see Dr. Raymondo Band pm 6/15 at 9am and follow up with me in 1 month.    Thank you for allowing [place] to be a part of your medical care!  Thank you, Dr. Robyne Peers

## 2021-06-27 ENCOUNTER — Telehealth: Payer: Self-pay

## 2021-06-27 NOTE — Telephone Encounter (Signed)
Spoke with patient regarding possible patient assistance with novo nordisk for tresiba.  Pt currently collecting unemployment and has lost insurance. Pt would like novo nordisk application mailed to his home and he will return to office oncecomplete.

## 2021-07-01 ENCOUNTER — Encounter: Payer: Self-pay | Admitting: *Deleted

## 2021-07-01 NOTE — Patient Instructions (Incomplete)
Thank you for coming to see me today. It was a pleasure.   We will get some labs today.  If they are abnormal or we need to do something about them, I will call you.  If they are normal, I will send you a message on MyChart (if it is active) or a letter in the mail.  If you don't hear from [place] in 2 weeks, please call the office at the number below.  Increase Tresiba by 2 units every 3 days to a blood sugar goal of 150   Follow up with Dr Raymondo Band as scheduled on the 15th of July at 9 am  Please follow-up with PCP on July 30th  If you have any questions or concerns, please do not hesitate to call the office at [phone].  Best,   Dana Allan, MD

## 2021-07-01 NOTE — Progress Notes (Signed)
    SUBJECTIVE:   CHIEF COMPLAINT / HPI: Follow-up diabetes  Presents for follow up for elevated blood glucose. Seen in clinic on 05/31 and treated with Tresiba 10 units, with instructions to increase by 2 units daily until CBG less than 150.  Since then patient reports had been up to 18 units of Tresiba but blood glucose this a.m. 268 fasting so today started 20 units of Tresiba.  Has not had any hypoglycemic events.  Has enough insulin until next visit with Dr. Raymondo Band on 15 June.  PERTINENT  PMH / PSH:  DM type II Hypertension OSA Hypertriglyceridemia Obesity class III  OBJECTIVE:   BP (!) 121/59   Pulse 71   Ht 6\' 2"  (1.88 m)   Wt (!) 368 lb 2 oz (167 kg)   SpO2 96%   BMI 47.26 kg/m    General: Alert, no acute distress Cardio: Normal S1 and S2, RRR, no r/m/g Pulm: CTAB, normal work of breathing Abdomen: Bowel sounds normal. Abdomen soft and non-tender.  Extremities: No peripheral edema.   ASSESSMENT/PLAN:   Diabetes mellitus (HCC) Blood glucose trending downward.  Patient tolerating .  Now at 20 units.  -Continue Tresiba 20 units s/c, titrate up by 2 units every 3 days to glucose goal of less than 150. -Continue metformin 1 g twice daily -Continue statin and ARB -Bmet today -Pen needles provided x3 packs. -Follow-up with Dr. [place] as previously scheduled on June 15 -Follow-up with PCP scheduled June 30     July 02, MD [facility]

## 2021-07-02 ENCOUNTER — Encounter: Payer: Self-pay | Admitting: Family Medicine

## 2021-07-02 ENCOUNTER — Ambulatory Visit (INDEPENDENT_AMBULATORY_CARE_PROVIDER_SITE_OTHER): Payer: PRIVATE HEALTH INSURANCE | Admitting: Family Medicine

## 2021-07-02 VITALS — BP 121/59 | HR 71 | Ht 74.0 in | Wt 368.1 lb

## 2021-07-02 DIAGNOSIS — E119 Type 2 diabetes mellitus without complications: Secondary | ICD-10-CM | POA: Diagnosis not present

## 2021-07-03 LAB — BASIC METABOLIC PANEL
BUN/Creatinine Ratio: 8 — ABNORMAL LOW (ref 9–20)
BUN: 8 mg/dL (ref 6–24)
CO2: 27 mmol/L (ref 20–29)
Calcium: 9.3 mg/dL (ref 8.7–10.2)
Chloride: 101 mmol/L (ref 96–106)
Creatinine, Ser: 0.95 mg/dL (ref 0.76–1.27)
Glucose: 300 mg/dL — ABNORMAL HIGH (ref 70–99)
Potassium: 4.7 mmol/L (ref 3.5–5.2)
Sodium: 140 mmol/L (ref 134–144)
eGFR: 102 mL/min/{1.73_m2} (ref >59)

## 2021-07-07 ENCOUNTER — Encounter: Payer: Self-pay | Admitting: Family Medicine

## 2021-07-09 ENCOUNTER — Encounter: Payer: Self-pay | Admitting: Family Medicine

## 2021-07-09 NOTE — Assessment & Plan Note (Addendum)
Blood glucose trending downward.  Patient tolerating [place].  Now at 20 units.  -Continue Tresiba 20 units s/c, titrate up by 2 units every 3 days to glucose goal of less than 150. -Continue metformin 1 g twice daily -Continue statin and ARB -Bmet today -Pen needles provided x3 packs. -Follow-up with Dr. Valentina Lucks as previously scheduled on June 15 -Follow-up with PCP scheduled June 30

## 2021-07-10 ENCOUNTER — Ambulatory Visit: Payer: PRIVATE HEALTH INSURANCE | Admitting: Pharmacist

## 2021-07-25 ENCOUNTER — Ambulatory Visit (INDEPENDENT_AMBULATORY_CARE_PROVIDER_SITE_OTHER): Payer: PRIVATE HEALTH INSURANCE | Admitting: Family Medicine

## 2021-07-25 ENCOUNTER — Encounter: Payer: Self-pay | Admitting: Family Medicine

## 2021-07-25 DIAGNOSIS — I10 Essential (primary) hypertension: Secondary | ICD-10-CM | POA: Diagnosis not present

## 2021-07-25 DIAGNOSIS — E119 Type 2 diabetes mellitus without complications: Secondary | ICD-10-CM

## 2021-07-25 MED ORDER — INSULIN DEGLUDEC 100 UNIT/ML ~~LOC~~ SOPN: 10.0000 [IU] | PEN_INJECTOR | Freq: Every day | SUBCUTANEOUS | 3 refills | Status: DC

## 2021-07-25 NOTE — Assessment & Plan Note (Signed)
-  BP 128/83, normotensive -continue losartan  -diet and exercise counseling provided

## 2021-07-25 NOTE — Assessment & Plan Note (Addendum)
-  tolerating DM regimen well, stressed importance on maintaining compliance to insulin regimen and to adjust up 2 units if glucose levels greater than 150 and to otherwise not increased if less than this. May go down 2 units if glucose level 100 or less -tresiba refills provided  -continue metformin 1000 mg bid -up to date on annual optho exam -continue statin  -follow up in 2 months for next A1c

## 2021-07-25 NOTE — Patient Instructions (Signed)
It was great seeing you today!  Today we discussed your diabetes, please make sure to take your insulin daily. If glucose levels are above 150 then please go up 2 units, otherwise please make sure to take your basal dose insulin daily. Continue to take your metformin 1000 mg twice daily.  Your blood pressure looks great, please continue to take all your medications.   Please follow up at your next scheduled appointment in 2 months, if anything arises between now and then, please don't hesitate to contact our office.   Thank you for allowing [place] to be a part of your medical care!  Thank you, Dr. Robyne Peers

## 2021-07-25 NOTE — Progress Notes (Signed)
    SUBJECTIVE:   CHIEF COMPLAINT / HPI:   Patient presents for diabetes follow up, he is accompanied by his wife. Has recently started on basal insulin after A1c >15 a few weeks ago. Taking Tresiba 22 units. Glucose levels have been around 120-130s over the past week, with the lowest being 108. Denies any hypoglycemic episodes. 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 Eats sandwiches with lean meats and a fruit cup for lunch. He has 1 can of soda a day with dinner but otherwise drinks plenty of water. Also compliant on metformin 1000 mg twice daily. Reports daily compliance on losartan without issues. Denies chest pain, dyspnea and leg swelling.   OBJECTIVE:   BP 128/83   Pulse 66   Ht 6\' 2"  (1.88 m)   Wt (!) 364 lb 4 oz (165.2 kg)   SpO2 98%   BMI 46.77 kg/m   General: Patient well-appearing, in no acute distress. CV: RRR, no murmurs or gallops auscultated Resp: CTAB, no wheezing, rales or rhonchi noted Ext: no LE edema noted bilaterally Psych: mood appropriate   ASSESSMENT/PLAN:   Diabetes mellitus (HCC) -tolerating DM regimen well, stressed importance on maintaining compliance to insulin regimen and to adjust up 2 units if glucose levels greater than 150 and to otherwise not increased if less than this. May go down 2 units if glucose level 100 or less -tresiba refills provided  -continue metformin 1000 mg bid -up to date on annual optho exam -continue statin  -follow up in 2 months for next A1c   Essential hypertension -BP 128/83, normotensive -continue losartan  -diet and exercise counseling provided     -PHQ-9 score of 0 reviewed.   , DO [place] [facility]

## 2021-08-05 ENCOUNTER — Telehealth: Payer: Self-pay

## 2021-08-05 DIAGNOSIS — E119 Type 2 diabetes mellitus without complications: Secondary | ICD-10-CM

## 2021-08-05 NOTE — Telephone Encounter (Signed)
Patient calls nurse line regarding refill on insulin. Patient reports that he has been taking [place] 20 units daily, however, is interested in restarting Ozempic.   Patient reports that he has had a change in his insurance that now covers Ozempic.   Patient is asking for provider recommendation on if he should continue tresiba or switch to Ozempic.   Please advise.   Veronda Prude, RN

## 2021-08-06 ENCOUNTER — Ambulatory Visit: Payer: PRIVATE HEALTH INSURANCE | Admitting: Nurse Practitioner

## 2021-08-07 ENCOUNTER — Other Ambulatory Visit: Payer: Self-pay

## 2021-08-07 DIAGNOSIS — E119 Type 2 diabetes mellitus without complications: Secondary | ICD-10-CM

## 2021-08-07 MED ORDER — INSULIN DEGLUDEC 100 UNIT/ML ~~LOC~~ SOPN: 20.0000 [IU] | PEN_INJECTOR | Freq: Every day | SUBCUTANEOUS | 1 refills | Status: DC

## 2021-08-07 NOTE — Telephone Encounter (Signed)
Patient returns call to nurse line. Patient scheduled for PCP follow up on 7/28. However, patient is out of insulin and is requesting Evaristo Bury be sent in to last until appointment at the end of the month.   Please advise.   Veronda Prude, RN

## 2021-08-07 NOTE — Telephone Encounter (Signed)
Called patient and LVM for patient to return call to office in order to schedule appointment.   Veronda Prude, RN

## 2021-08-08 MED ORDER — INSULIN DEGLUDEC 100 UNIT/ML ~~LOC~~ SOPN: 20.0000 [IU] | PEN_INJECTOR | Freq: Every day | SUBCUTANEOUS | 1 refills | Status: AC

## 2021-08-08 NOTE — Telephone Encounter (Signed)
Medication sent over as "sample."   [facility] to [facility] [address].

## 2021-08-15 ENCOUNTER — Ambulatory Visit: Payer: PRIVATE HEALTH INSURANCE | Admitting: Nurse Practitioner

## 2021-08-15 ENCOUNTER — Telehealth: Payer: Self-pay | Admitting: *Deleted

## 2021-08-15 ENCOUNTER — Other Ambulatory Visit: Payer: Self-pay | Admitting: Student

## 2021-08-15 DIAGNOSIS — E119 Type 2 diabetes mellitus without complications: Secondary | ICD-10-CM

## 2021-08-15 MED ORDER — INSULIN PEN NEEDLE 32G X 4 MM MISC: 0 refills | Status: DC

## 2021-08-15 NOTE — Telephone Encounter (Signed)
Pt called in stating that they sent his tresidba in but didn't send any needles. Please advise. Taiden Raybourn Bruna Potter, CMA

## 2021-08-15 NOTE — Progress Notes (Signed)
Insulin needle for [place]. 32 guage

## 2021-08-21 ENCOUNTER — Telehealth: Payer: Self-pay

## 2021-08-21 DIAGNOSIS — E119 Type 2 diabetes mellitus without complications: Secondary | ICD-10-CM

## 2021-08-21 MED ORDER — INSULIN PEN NEEDLE 32G X 4 MM MISC: 0 refills | Status: AC

## 2021-08-21 NOTE — Telephone Encounter (Signed)
Patient calls nurse line regarding prescription for pen needles. [facility] needs [facility] to [facility] [address]. Canceled prescription at [facility] on [address]. Resent [facility] to [facility] [address].   Veronda Prude, RN

## 2021-08-22 ENCOUNTER — Telehealth: Payer: Self-pay

## 2021-08-22 ENCOUNTER — Ambulatory Visit: Payer: PRIVATE HEALTH INSURANCE | Admitting: Student

## 2021-08-22 NOTE — Telephone Encounter (Signed)
Patient calls nurse line regarding diabetes medication concerns. Patient reports that since increasing metformin and starting [place] he has been experiencing drops in blood sugar levels.   Reports that blood sugar dropped today to 105 and patient reports feeling jittery. He ate an oatmeal cake and started feeling better.   Patient takes [place] 20 units daily and metformin 1,000 mg BID.   Precepted with Dr. Jennette Kettle who advised that patient decrease to 15 units of Tresiba daily and metformin 500 mg BID.   Provided patient with these instructions and scheduled follow up appointment on 8/4. ED precautions given over the phone and sent patient mychart message with written instructions.   Veronda Prude, RN

## 2021-08-29 ENCOUNTER — Encounter: Payer: Self-pay | Admitting: Family Medicine

## 2021-08-29 ENCOUNTER — Ambulatory Visit (INDEPENDENT_AMBULATORY_CARE_PROVIDER_SITE_OTHER): Payer: BC Managed Care – PPO | Admitting: Family Medicine

## 2021-08-29 VITALS — BP 127/66 | HR 61 | Ht 74.0 in | Wt 371.0 lb

## 2021-08-29 DIAGNOSIS — Z794 Long term (current) use of insulin: Secondary | ICD-10-CM | POA: Diagnosis not present

## 2021-08-29 DIAGNOSIS — E119 Type 2 diabetes mellitus without complications: Secondary | ICD-10-CM

## 2021-08-29 DIAGNOSIS — M549 Dorsalgia, unspecified: Secondary | ICD-10-CM | POA: Diagnosis not present

## 2021-08-29 MED ORDER — CYCLOBENZAPRINE HCL 10 MG PO TABS: 10.0000 mg | ORAL_TABLET | Freq: Three times a day (TID) | ORAL | 0 refills | Status: DC | PRN

## 2021-08-29 NOTE — Assessment & Plan Note (Signed)
Physical exam consistent with muscular spasm, no skeletal abnormalities or concerns at this time.  No red flag symptoms. - Recommended OTC lidocaine 4% patches - Flexeril 10 mg 3 times daily as needed - Flexeril precautions with heavy machinery given - Follow-up in the next 2 weeks if no improvement

## 2021-08-29 NOTE — Assessment & Plan Note (Signed)
Home blood sugars have been in the low 100s intermittently.  Patient had hypoglycemic symptoms of shakiness last week and decreased the [place] to 15 units.  He has not had any further symptoms.  Most recent CBG was 150. - Continue Tresiba 15 units - Continue metformin 500 mg twice daily - A1c at next visit with Dr. Robyne Peers on 9/6 - Return precautions given and persistent hypoglycemic episodes, would decrease by 5 units if persistent

## 2021-08-29 NOTE — Progress Notes (Signed)
    SUBJECTIVE:   CHIEF COMPLAINT / HPI:   T2DM - Patient recently started on  - 500mg  metformin BID - Tresiba 15Units  - Measurement last night was 150 - Highest measurements have been in the 200s  Back pain - Last year injured muscle in his back - Spasms for about 1 month - [facility] 1 week of a muscle relaxer and pain medication (non-opiate)  PERTINENT  PMH / PSH: Reviewed  OBJECTIVE:   BP 127/66   Pulse 61   Ht 6\' 2"  (1.88 m)   Wt (!) 371 lb (168.3 kg)   SpO2 98%   BMI 47.63 kg/m   Gen: well-appearing, NAD CV: RRR, no m/r/g appreciated, no peripheral edema Pulm: CTAB, no wheezes/crackles GI: soft, non-tender, non-distended MSK: TTP in the right mid-back region, spine and paraspinal muscles non-tender to palpation. No obvious deformity noted, some muscular hypertrophy on the right side compared to left.   ASSESSMENT/PLAN:   Mid back pain on right side Physical exam consistent with muscular spasm, no skeletal abnormalities or concerns at this time.  No red flag symptoms. - Recommended OTC lidocaine 4% patches - Flexeril 10 mg 3 times daily as needed - Flexeril precautions with heavy machinery given - Follow-up in the next 2 weeks if no improvement  Diabetes mellitus (HCC) Home blood sugars have been in the low 100s intermittently.  Patient had hypoglycemic symptoms of shakiness last week and decreased the 03-01-1974 to 15 units.  He has not had any further symptoms.  Most recent CBG was 150. - Continue Tresiba 15 units - Continue metformin 500 mg twice daily - A1c at next visit with Dr. on 9/6 - Return precautions given and persistent hypoglycemic episodes, would decrease by 5 units if persistent     Satina Jerrell, DO [facility]

## 2021-08-29 NOTE — Patient Instructions (Signed)
I am sending in a medication called Flexeril, he can take this medication 3 times a day but I would start taking it at night to see how you tolerate it.  You can cut the pill in half if you want to see how you do with the lower dose as well.  Behavior when operating heavy machinery with this medication.  If you continue to have significant muscle spasms that are not improved with this medication, then I would recommend coming back to the office to see we can do for you.  If you continue to have hypoglycemic symptoms (shaking, diaphoresis/sweating, nauseous or vomiting) then I would want you to decrease your insulin by another 5 units until you can be seen.

## 2021-10-01 ENCOUNTER — Ambulatory Visit (INDEPENDENT_AMBULATORY_CARE_PROVIDER_SITE_OTHER): Payer: PRIVATE HEALTH INSURANCE | Admitting: Family Medicine

## 2021-10-01 ENCOUNTER — Encounter: Payer: Self-pay | Admitting: Family Medicine

## 2021-10-01 VITALS — BP 141/86 | HR 67 | Ht 74.0 in | Wt 376.6 lb

## 2021-10-01 DIAGNOSIS — E119 Type 2 diabetes mellitus without complications: Secondary | ICD-10-CM | POA: Diagnosis not present

## 2021-10-01 DIAGNOSIS — Z794 Long term (current) use of insulin: Secondary | ICD-10-CM

## 2021-10-01 DIAGNOSIS — E781 Pure hyperglyceridemia: Secondary | ICD-10-CM | POA: Diagnosis not present

## 2021-10-01 DIAGNOSIS — I10 Essential (primary) hypertension: Secondary | ICD-10-CM | POA: Diagnosis not present

## 2021-10-01 LAB — POCT GLYCOSYLATED HEMOGLOBIN (HGB A1C): HbA1c, POC (controlled diabetic range): 7.9 % — AB (ref 0.0–7.0)

## 2021-10-01 MED ORDER — ATORVASTATIN CALCIUM 10 MG PO TABS: ORAL_TABLET | ORAL | 0 refills | Status: DC

## 2021-10-01 MED ORDER — METFORMIN HCL ER 500 MG PO TB24: 500.0000 mg | ORAL_TABLET | Freq: Every day | ORAL | 1 refills | Status: DC

## 2021-10-01 MED ORDER — ACCU-CHEK SOFTCLIX LANCETS MISC: 12 refills | Status: AC

## 2021-10-01 MED ORDER — LOSARTAN POTASSIUM 25 MG PO TABS: 25.0000 mg | ORAL_TABLET | Freq: Every day | ORAL | 3 refills | Status: DC

## 2021-10-01 MED ORDER — ACCU-CHEK GUIDE VI STRP: ORAL_STRIP | 12 refills | Status: AC

## 2021-10-01 NOTE — Progress Notes (Signed)
    SUBJECTIVE:   CHIEF COMPLAINT / HPI:   Patient presents for diabetes follow up, he is accompanied by his wife. Compliant on his diabetes regimen, he has gone down on his metformin to 500 mg once daily since his glucose levels have been improved. Checks glucose levels daily, ranging around 90-190s. Average has been around 130-150s. Tresiba 16 units now. Dietary habits have improved, his job keeps him very physically active throughout the day. He eats plenty of vegetables and drinks mostly water, will sip a small bottle of soda throughout the day most days but no other soda or juice intake.   OBJECTIVE:   BP (!) 141/86   Pulse 67   Ht 6\' 2"  (1.88 m)   Wt (!) 376 lb 9.6 oz (170.8 kg)   SpO2 98%   BMI 48.35 kg/m   General: Patient well-appearing, in no acute distress. CV: RRR, no murmurs or gallops auscultated Resp: CTAB, no wheezing, rales or rhonchi noted Abdomen: soft, nontender, presence of bowel sounds Ext: no LE edema noted bilaterally, normal foot exam including microfilament testing, sensation and no wounds  Psych: mood appropriate, pleasant   ASSESSMENT/PLAN:   Diabetes mellitus (HCC) -A1c 7.9, at goal of <8 -congratulated patient on all his hard work and successful progress -foot exam normal -adjusted to tresiba 8 units daily and to decrease by 1 unit for any glucose level less than 130 to avoid hypoglycemic episodes, otherwise continue current diabetes regimen -diet and exercise counseling provided -follow up in 3 months for next A1c check   -Med rec reviewed and updated appropriately, refills provided.  -PHQ-9 score of 0 reviewed.     , DO [facility]

## 2021-10-01 NOTE — Assessment & Plan Note (Signed)
-  A1c 7.9, at goal of <8 -congratulated patient on all his hard work and successful progress -foot exam normal -adjusted to tresiba 8 units daily and to decrease by 1 unit for any glucose level less than 130 to avoid hypoglycemic episodes, otherwise continue current diabetes regimen -diet and exercise counseling provided -follow up in 3 months for next A1c check

## 2021-10-01 NOTE — Patient Instructions (Addendum)
It was great seeing you today!  Today we discussed your diabetes, you have been doing so great with working on things. Your A1c is 7.9! Please take tresiba 8 units daily and adjust by 1 for any blood sugar levels less than 130. If you get dizzy or weak then please call our clinic.   Please continue to stay physically active and eat a balanced diet.   Please follow up at your next scheduled appointment in 3 months, if anything arises between now and then, please don't hesitate to contact our office.   Thank you for allowing [place] to be a part of your medical care!  Thank you, Dr. Robyne Peers

## 2021-11-14 ENCOUNTER — Telehealth: Payer: Self-pay

## 2021-11-14 ENCOUNTER — Other Ambulatory Visit: Payer: Self-pay | Admitting: Student

## 2021-11-14 ENCOUNTER — Ambulatory Visit (INDEPENDENT_AMBULATORY_CARE_PROVIDER_SITE_OTHER): Payer: PRIVATE HEALTH INSURANCE | Admitting: Student

## 2021-11-14 VITALS — BP 129/83 | HR 76 | Ht 74.0 in | Wt 384.2 lb

## 2021-11-14 DIAGNOSIS — R42 Dizziness and giddiness: Secondary | ICD-10-CM | POA: Insufficient documentation

## 2021-11-14 DIAGNOSIS — I10 Essential (primary) hypertension: Secondary | ICD-10-CM

## 2021-11-14 DIAGNOSIS — G4733 Obstructive sleep apnea (adult) (pediatric): Secondary | ICD-10-CM

## 2021-11-14 DIAGNOSIS — Z23 Encounter for immunization: Secondary | ICD-10-CM | POA: Diagnosis not present

## 2021-11-14 MED ORDER — MECLIZINE HCL 12.5 MG PO TABS: 12.5000 mg | ORAL_TABLET | ORAL | 0 refills | Status: DC | PRN

## 2021-11-14 MED ORDER — CETIRIZINE HCL 10 MG PO TABS: 10.0000 mg | ORAL_TABLET | Freq: Every day | ORAL | 3 refills | Status: DC

## 2021-11-14 NOTE — Assessment & Plan Note (Signed)
Stressed importance of wearing CPAP.  Discussed that not doing so increases his risk of disease oriented outcomes of stroke, heart attack.  Discussed patient oriented outcomes and that wearing his mask could help his energy levels, decreased headaches, wake up more refreshed.

## 2021-11-14 NOTE — Telephone Encounter (Signed)
Patient calls nurse line regarding dizzy spells. He states that he has been having these since Wednesday. Feels like the room is spinning.   He reports normal blood sugar levels. Denies vomiting, diarrhea, or new changes in medications.   Reports slight nausea.   Scheduled for this afternoon for further evaluation.   Talbot Grumbling, RN

## 2021-11-14 NOTE — Progress Notes (Signed)
    SUBJECTIVE:   CHIEF COMPLAINT / HPI:   Anthony Collier is a 46 year old male with history of type 2 diabetes requiring use of insulin here for dizziness that started 2 days ago in the middle the night. He says he woke up and feel like the room was spinning.  Denies any feelings of lightheadedness, presyncope or actual syncope. Denies any numbness, tingling, paresthesias. He said that he felt little bit shaky and so he was also wondering if his blood sugar was not low at that time.  However, he did not check his blood sugar during this episode of dizziness. He has been checking his blood sugar since then has not had any hypoglycemic episodes. Otherwise, he has no complaints or concerns.  He is here with his wife who is also concerned that he does not wear his CPAP mask at night.  PERTINENT  PMH / PSH: Reviewed  OBJECTIVE:   BP 129/83   Pulse 76   Ht 6\' 2"  (1.88 m)   Wt (!) 384 lb 4 oz (174.3 kg)   SpO2 99%   BMI 49.33 kg/m   General: Well-appearing, pleasant, no distress, obese CV: Regular rate and rhythm Respiratory: Normal work of breathing on room air, no wheezing or crackles.  Difficult to auscultate secondary to body habitus Neuro: Awake, alert, oriented.  Extraocular movements intact.  No nystagmus.  Pupils PERRLA.  No neurologic focal deficits.  Speech clear and fluent.  Walks unassisted.  Gait normal.   ASSESSMENT/PLAN:   Essential hypertension BP 129/83, normotensive.  Continue regimen.  Severe obstructive sleep apnea Stressed importance of wearing CPAP.  Discussed that not doing so increases his risk of disease oriented outcomes of stroke, heart attack.  Discussed patient oriented outcomes and that wearing his mask could help his energy levels, decreased headaches, wake up more refreshed.  Vertigo Well-appearing 46 year old male with episodes of spinning sensation.  Neurologic exam unremarkable today.   This is likely due to BPPV.  Also considered orthostatic  hypotension, hyperglycemia..  Considered other less likely, but more concerning causes including CVA, head trauma, medication effect. Provided reassurance.  Also prescribed meclizine as needed for symptoms.  Encouraged not to drive or use heavy machinery while using meclizine due to possible drowsiness. Also encouraged to check sugars and let us know if he is hypoglycemic. Return precautions discussed.     Anthony Collier, [place]

## 2021-11-14 NOTE — Assessment & Plan Note (Signed)
BP 129/83, normotensive.  Continue regimen.

## 2021-11-14 NOTE — Patient Instructions (Signed)
So great meeting you today. Your symptoms sound like vertigo.  You can take meclizine should your symptoms recur, but please do not drive or operate heavy machinery as this can make you sleepy. If your symptoms persist, or you develop weakness, numbness, changes in speech please seek urgent care.  Please keep a close eye on your blood sugars as dizziness is also a symptom of low blood sugar.  I sent in both meclizine (for vertigo) and cetirizine (for allergies) to your pharmacy.  Take the cetirizine once daily.  Have a wonderful day, Dr. Owens Shark

## 2021-11-14 NOTE — Assessment & Plan Note (Signed)
Well-appearing 47 year old male with episodes of spinning sensation.  Neurologic exam unremarkable today.   This is likely due to BPPV.  Also considered orthostatic hypotension, hyperglycemia..  Considered other less likely, but more concerning causes including CVA, head trauma, medication effect. Provided reassurance.  Also prescribed meclizine as needed for symptoms.  Encouraged not to drive or use heavy machinery while using meclizine due to possible drowsiness. Also encouraged to check sugars and let us know if he is hypoglycemic. Return precautions discussed.

## 2022-01-02 ENCOUNTER — Ambulatory Visit: Payer: PRIVATE HEALTH INSURANCE | Admitting: Family Medicine

## 2022-01-05 ENCOUNTER — Other Ambulatory Visit: Payer: Self-pay | Admitting: Family Medicine

## 2022-01-05 DIAGNOSIS — E781 Pure hyperglyceridemia: Secondary | ICD-10-CM

## 2022-01-23 ENCOUNTER — Ambulatory Visit: Payer: PRIVATE HEALTH INSURANCE | Admitting: Family Medicine

## 2022-02-06 ENCOUNTER — Ambulatory Visit: Payer: PRIVATE HEALTH INSURANCE | Admitting: Family Medicine

## 2022-02-06 ENCOUNTER — Encounter: Payer: Self-pay | Admitting: Family Medicine

## 2022-02-06 VITALS — BP 136/81 | HR 74 | Ht 74.0 in | Wt 399.2 lb

## 2022-02-06 DIAGNOSIS — Z794 Long term (current) use of insulin: Secondary | ICD-10-CM | POA: Diagnosis not present

## 2022-02-06 DIAGNOSIS — I10 Essential (primary) hypertension: Secondary | ICD-10-CM

## 2022-02-06 DIAGNOSIS — E781 Pure hyperglyceridemia: Secondary | ICD-10-CM

## 2022-02-06 DIAGNOSIS — E119 Type 2 diabetes mellitus without complications: Secondary | ICD-10-CM

## 2022-02-06 LAB — POCT GLYCOSYLATED HEMOGLOBIN (HGB A1C): HbA1c, POC (controlled diabetic range): 7.3 % — AB (ref 0.0–7.0)

## 2022-02-06 NOTE — Assessment & Plan Note (Addendum)
-  A1c 7.3, at goal of <8. Congratulated patient on all his hard work and progress. -continue current regimen -follow up in 3 months for repeat A1c, goal of eventually getting off insulin and starting ozempic for continued weight loss

## 2022-02-06 NOTE — Assessment & Plan Note (Signed)
-  BP 136/81, at goal of <140/90 -continue current anti-hypertensive regimen -diet and exercise counseling provided

## 2022-02-06 NOTE — Patient Instructions (Signed)
It was great seeing you today!  Today we discussed your diabetes, your A1c is 7.3! Congratulations on all your hard work, continue to do what you are doing! Make sure to follow up with the eye doctor next week and to have follow up at least once a year with them. Continue to record your blood sugars and bring to your next visit.   Your blood pressure also looks great, please continue to take your losartan.   Please follow up at your next scheduled appointment in 3 months, if anything arises between now and then, please don't hesitate to contact our office.   Thank you for allowing [place] to be a part of your medical care!  Thank you, Dr. Larae Grooms  Also a reminder of our clinic's no-show policy. Please make sure to arrive at least 15 minutes prior to your scheduled appointment time. Please try to cancel before 24 hours if you are not able to make it. If you no-show for 2 appointments then you will be receiving a warning letter. If you no-show after 3 visits, then you may be at risk of being dismissed from our clinic. This is to ensure that everyone is able to be seen in a timely manner. Thank you, we appreciate your assistance with this!

## 2022-02-06 NOTE — Assessment & Plan Note (Signed)
-  continue statin -plan to repeat lipid panel at next visit

## 2022-02-06 NOTE — Progress Notes (Signed)
    SUBJECTIVE:   CHIEF COMPLAINT / HPI:   Patient presents for DM follow up, he is accompanied by his wife. Last A1c 7.9, improved after starting insulin regimen. They have had a lot going on over the holidays including mother-in-law passing away. He still feels that he has been on track with his DM. Started giving himself his insulin with all his wife was going through. Reports compliance with regimen, taking 8 units of insulin daily and metformin 500 mg daily.  Has ophthalmology appointment next Friday. Limiting soda intake significantly since he was diagnosed with DM and barely eats sweets. Glucose levels 130-150s. Denies hypoglycemic episodes.   History of hypertension. Denies chest pain, dyspnea and leg swelling. Compliant on losartan. Denies any concerns regarding his blood pressure or other concerns.     OBJECTIVE:   BP 136/81   Pulse 74   Ht 6\' 2"  (1.88 m)   Wt (!) 399 lb 4 oz (181.1 kg)   SpO2 99%   BMI 51.26 kg/m   General: Patient well-appearing, in no acute distress. CV: RRR, no murmurs or gallops auscultated  Resp: CTAB, no wheezing, rales or rhonchi  Ext: no LE edema noted bilaterally   ASSESSMENT/PLAN:   Diabetes mellitus (HCC) -A1c 7.3, at goal of <8. Congratulated patient on all his hard work and progress. -continue current regimen -follow up in 3 months for repeat A1c, goal of eventually getting off insulin and starting ozempic for continued weight loss  Essential hypertension -BP 136/81, at goal of <140/90 -continue current anti-hypertensive regimen -diet and exercise counseling provided  Hypertriglyceridemia -continue statin -plan to repeat lipid panel at next visit   -Med rec reviewed and updated appropriately  -PHQ-9 score of 0 reviewed.    Donney Dice, [place]

## 2022-03-22 ENCOUNTER — Other Ambulatory Visit: Payer: Self-pay | Admitting: Family Medicine

## 2022-03-22 DIAGNOSIS — E119 Type 2 diabetes mellitus without complications: Secondary | ICD-10-CM

## 2022-04-20 ENCOUNTER — Telehealth: Payer: Self-pay

## 2022-04-20 NOTE — Telephone Encounter (Signed)
Patient calls nurse line requesting a refill on Tresiba.   Patient reports he has been continuing to do 8 units daily. He reports he made an apt with PCP on 4/17, however reports he will run out of insulin before then.   I do not see this on his current medication list.   Will forward to PCP.   [facility] [address]

## 2022-04-21 ENCOUNTER — Other Ambulatory Visit: Payer: Self-pay | Admitting: Family Medicine

## 2022-04-21 DIAGNOSIS — E119 Type 2 diabetes mellitus without complications: Secondary | ICD-10-CM

## 2022-04-21 MED ORDER — INSULIN DEGLUDEC 100 UNIT/ML ~~LOC~~ SOPN: 8.0000 [IU] | PEN_INJECTOR | Freq: Every day | SUBCUTANEOUS | 3 refills | Status: DC

## 2022-04-22 ENCOUNTER — Other Ambulatory Visit: Payer: Self-pay | Admitting: Family Medicine

## 2022-04-22 DIAGNOSIS — E781 Pure hyperglyceridemia: Secondary | ICD-10-CM

## 2022-05-13 ENCOUNTER — Ambulatory Visit (INDEPENDENT_AMBULATORY_CARE_PROVIDER_SITE_OTHER): Payer: BC Managed Care – PPO | Admitting: Family Medicine

## 2022-05-13 VITALS — BP 157/97 | HR 71 | Ht 74.0 in | Wt >= 6400 oz

## 2022-05-13 DIAGNOSIS — E119 Type 2 diabetes mellitus without complications: Secondary | ICD-10-CM | POA: Diagnosis not present

## 2022-05-13 DIAGNOSIS — E781 Pure hyperglyceridemia: Secondary | ICD-10-CM

## 2022-05-13 DIAGNOSIS — Z794 Long term (current) use of insulin: Secondary | ICD-10-CM

## 2022-05-13 DIAGNOSIS — I10 Essential (primary) hypertension: Secondary | ICD-10-CM | POA: Diagnosis not present

## 2022-05-13 LAB — POCT GLYCOSYLATED HEMOGLOBIN (HGB A1C): HbA1c, POC (controlled diabetic range): 8 % — AB (ref 0.0–7.0)

## 2022-05-13 NOTE — Progress Notes (Signed)
    SUBJECTIVE:   CHIEF COMPLAINT / HPI:   Patient presents for DM follow up. He endorses compliance in his regimen of tresiba 8 units and metformin 500 mg daily. Denies any hypoglycemic episodes. Due for A1c today. Has been doing well on his regimen and continuing with dietary efforts. He typically maintains a high degree of physical activity with his occupation but has not been to work as frequently as his wife, who also accompanies him today, received a renal transplant earlier in March. He has spent a lot of time taking care of her and they have had a busy month. Otherwise reports doing well. He has not yet seen the ophthalmologist this year but is scheduled for an upcoming appointment.   History of hypertension, compliant on losartan 25 mg daily. Denies chest pain, dyspnea or leg swelling. Denies any other symptoms.   OBJECTIVE:   BP (!) 144/95   Pulse 71   Ht  (1.88 m)   Wt (!) 400 lb 12.8 oz (181.8 kg)   SpO2 99%   BMI 51.46 kg/m   General: Patient well-appearing, in no acute distress. CV: RRR, no murmurs or gallops auscultated Resp: CTAB, no wheezing, rales or rhonchi noted Ext: no LE edema noted bilaterally Psych: mood appropriate, very pleasant   ASSESSMENT/PLAN:   Diabetes mellitus (HCC) -A1c 8, at goal between 7-8. Congratulated patient on all his hard work, although they have had personal stressors since the last visit, he has still successfully managed to control his DM. -continue tresiba 8 units daily -continue metformin 500 mg daily -upcoming ophthalmology appointment, discussed importance of annual routine follow up with ophthalmology  -follow up in 3 months for next A1c   Essential hypertension -BP 144/95, slightly elevated from goal of <140/90, likely secondary to personal stressors  -continue losartan 25 mg  -advised to check BP twice daily and maintain log, bring log to next visit -follow up in 2 weeks for BP check, consider amb BP monitoring if still  elevated and may need to increase losartan to 50 mg daily  Hypertriglyceridemia -pending lipid panel -continue atorvastatin daily -diet and exercise counseling   -Med rec reviewed and updated appropriately   Reece Leader, DO [facility]

## 2022-05-13 NOTE — Assessment & Plan Note (Signed)
-  BP 144/95, slightly elevated from goal of <140/90, likely secondary to personal stressors  -continue losartan 25 mg  -advised to check BP twice daily and maintain log, bring log to next visit -follow up in 2 weeks for BP check, consider amb BP monitoring if still elevated and may need to increase losartan to 50 mg daily

## 2022-05-13 NOTE — Assessment & Plan Note (Signed)
-  A1c 8, at goal between 7-8. Congratulated patient on all his hard work, although they have had personal stressors since the last visit, he has still successfully managed to control his DM. -continue tresiba 8 units daily -continue metformin 500 mg daily -upcoming ophthalmology appointment, discussed importance of annual routine follow up with ophthalmology  -follow up in 3 months for next A1c

## 2022-05-13 NOTE — Assessment & Plan Note (Signed)
-  pending lipid panel -continue atorvastatin daily -diet and exercise counseling

## 2022-05-13 NOTE — Patient Instructions (Addendum)
It was great seeing you today!  Today we discussed many things, your A1c is 8 which is great. Having your diabetes under control will help your overall health.   Please continue to take losartan daily. Your blood pressure was a little high, please check it twice daily and keep a log. Bring this to your next visit.   We will also check your cholesterol, continue to take your atorvastatin daily. I will call you with any abnormal results.   Please follow up at your next scheduled appointment in 2 weeks, if anything arises between now and then, please don't hesitate to contact our office.   Thank you for allowing [place] to be a part of your medical care!  Thank you, Dr. Robyne Peers  Also a reminder of our clinic's no-show policy. Please make sure to arrive at least 15 minutes prior to your scheduled appointment time. Please try to cancel before 24 hours if you are not able to make it. If you no-show for 2 appointments then you will be receiving a warning letter. If you no-show after 3 visits, then you may be at risk of being dismissed from our clinic. This is to ensure that everyone is able to be seen in a timely manner. Thank you, we appreciate your assistance with this!

## 2022-05-14 LAB — LIPID PANEL
Chol/HDL Ratio: 3.2 ratio (ref 0.0–5.0)
Cholesterol, Total: 134 mg/dL (ref 100–199)
HDL: 42 mg/dL (ref >39)
LDL Chol Calc (NIH): 78 mg/dL (ref 0–99)
Triglycerides: 69 mg/dL (ref 0–149)
VLDL Cholesterol Cal: 14 mg/dL (ref 5–40)

## 2022-05-29 ENCOUNTER — Ambulatory Visit: Payer: BC Managed Care – PPO | Admitting: Family Medicine

## 2022-06-01 ENCOUNTER — Ambulatory Visit: Payer: BC Managed Care – PPO | Admitting: Family Medicine

## 2022-06-29 ENCOUNTER — Telehealth: Payer: Self-pay

## 2022-06-29 NOTE — Telephone Encounter (Signed)
Patient attempted to be outreached by Mack Guise on 06/29/22 to discuss hypertension. Left voicemail for patient to return our call at their convenience at [phone].  Mack Guise, Student-PharmD

## 2022-06-30 ENCOUNTER — Encounter: Payer: Self-pay | Admitting: Family Medicine

## 2022-06-30 ENCOUNTER — Ambulatory Visit (INDEPENDENT_AMBULATORY_CARE_PROVIDER_SITE_OTHER): Payer: BC Managed Care – PPO | Admitting: Family Medicine

## 2022-06-30 VITALS — BP 153/89 | HR 84 | Ht 74.0 in | Wt 392.8 lb

## 2022-06-30 DIAGNOSIS — M25562 Pain in left knee: Secondary | ICD-10-CM

## 2022-06-30 HISTORY — DX: Pain in left knee: M25.562

## 2022-06-30 MED ORDER — DICLOFENAC SODIUM 75 MG PO TBEC: 75.0000 mg | DELAYED_RELEASE_TABLET | Freq: Two times a day (BID) | ORAL | 0 refills | Status: DC

## 2022-06-30 NOTE — Progress Notes (Deleted)
  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 His pain is circumferential around the level of the left knee joint line. He complains that his knee is stiff upon starting to move and then his pain as he continues working or activity. The patient says that after a long day of work he feels pain to the knee while in bed. He has never had lasting knee pain before. The patient has tried icy hot, tylenol, aspirin, ibuprofen 800 mg, and gabapentin 300 mg without alleviation. He denies hot knee or fever, preceding infection, history of gout, trauma/injury, grinding or popping. His stiffness is not noticeably present in the morning. He says that he has had uneven leg sizes since being a teenager and that he had poliomyelitis as a child. He has not had any shortness of breath, palpitations, or periods of sedentary lifestyle recently.   OBJECTIVE:   BP (!) 151/74   Pulse 84   Ht 6\' 2"  (1.88 m)   Wt (!) 392 lb 12.8 oz (178.2 kg)   SpO2 100%   BMI 50.43 kg/m  Gen: Well appearing, alert and oriented to conversation. Extremities: Left knee tenderness to palpation along the medial joint line and musculature. Valgus stress (Lower leg to lateral direction) to left lower extremity elicits pain on the medial knee, but no excessive laxity/mobility is present. Non-tender patella, tibia, and lateral joint line bilaterally. Positive straight leg raise on the left. Figure 4 sign negative. When standing on the right or left knee alone and twisting, he does not feel pain or any clicking/clunking abnormalities. Log roll of the left leg does not elicit pain. Laxity testing anteriorly and posteriorly is normal and without pain. Pivot shift and reverse pivot shift negative for pain or  abnormal ROM. Passive ROM is not painful, however some general pain with flexion of the left knee past 90 degrees however ROM is not limited. Strength to extension/flexion of the knee and hip are 5/5 and symmetric bilaterally. Patellar and achilles reflexes 2+. Entire left lower extremity is larger than the right without signs of edema, which has been present for 30+ years per patient. No excessive heat/warmth or overlying signs of trauma.  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 No trauma, signs of infection, or gout. Exam reveals medial TTP and medial joint pain with valgus stress testing, some generalized pain with flexion past 90 degrees. Ottowa knee rule completely negative. Considering generalized pain and medial pain; DDx: Osteoarthritis, pes anserine bursitis, medial mensical injury, MCL damage. I believe that osteoarthritis is the most likely given the timing of pain, and starting his job, and without trauma. If outpatient treatment fails, will consider xray or referral to sports med. -Treat with leg strengthening exercises, Voltaren gel application -Follow up in 2 weeks  FOLLOW UP: Follow up in 2 weeks for recheck  Kayleen Memos, Medical Student [facility]

## 2022-06-30 NOTE — Assessment & Plan Note (Signed)
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 No trauma, signs of infection, or gout. Exam reveals medial TTP and medial joint pain with valgus stress testing, some generalized pain with flexion past 90 degrees. Ottowa knee rule completely negative. Considering generalized pain and medial pain; DDx: Osteoarthritis, pes anserine bursitis, medial mensical injury, MCL damage. I believe that osteoarthritis is the most likely given the timing of pain, and starting his job, and without trauma. If outpatient treatment fails, will consider xray or referral to sports med. -Treat with leg strengthening exercises, Voltaren gel application -Follow up in 2 weeks

## 2022-06-30 NOTE — Patient Instructions (Addendum)
It was great seeing you today!  Today we discussed your knee pain, it seems that this may be from being on your feet a lot. Try to rest whenever you can, I have attached some strengthening exercises. Please try to do these at least 1-2 times a day. I have also prescribed voltaren gel, you may apply this to the affected area to see if it helps. Make sure to wear a brace when you are more active and at work.   Continue to exercise, weight loss will improve knee pain as well.  Please return in 2 weeks, if it has not improved then we can think about getting an x-ray and possible referral to the sports medicine clinic.   Please follow up at your next scheduled appointment in  2 weeks, if anything arises between now and then, please don't hesitate to contact our office.   Thank you for allowing [place] to be a part of your medical care!  Thank you, Dr. Robyne Peers

## 2022-06-30 NOTE — Progress Notes (Unsigned)
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 His pain is circumferential around the level of the left knee joint line. He complains that his knee is stiff upon starting to move and then his pain as he continues working or activity. The patient says that after a long day of work he feels pain to the knee while in bed. He has never had lasting knee pain before. The patient has tried icy hot, tylenol, aspirin and ibuprofen without alleviation. He denies hot knee or fever, preceding infection, history of gout, trauma/injury, grinding or popping. His stiffness is not noticeably present in the morning. He says that he has had uneven leg sizes since being a teenager. He has not had any shortness of breath, palpitations, or periods of sedentary lifestyle recently.  OBJECTIVE:   BP (!) 153/89   Pulse 84   Ht 6\' 2"  (1.88 m)   Wt (!) 392 lb 12.8 oz (178.2 kg)   SpO2 100%   BMI 50.43 kg/m  Gen: Well appearing, alert and oriented to conversation. Extremities: Left knee tenderness to palpation along the medial joint line and musculature. Valgus stress (Lower leg to lateral direction) to left lower extremity elicits pain on the medial knee, but no excessive laxity/mobility is present. Non-tender patella, tibia, and lateral joint line bilaterally. Positive straight leg raise on the left. Negative Thessaly's and McMurray's. Negative Anterior and posterior drawer testing. Negative Lachman's testing. Log roll of the left leg does not elicit pain. Laxity testing anteriorly and posteriorly is normal and without pain. Pivot shift and reverse pivot shift negative for pain or abnormal ROM. Passive ROM is not painful and has full active ROM. Strength to extension/flexion of the knee  and hip are 5/5 and symmetric bilaterally. Patellar and achilles reflexes 2+. Gross sensation intact bilaterally. No erythema, edema or cyanosis noted. No excessive heat/warmth or overlying signs of trauma. Normal gait.  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 No trauma, signs of infection, or gout. Exam reveals medial TTP and medial joint pain with valgus stress testing, some generalized pain with flexion past 90 degrees. Ottowa knee rule completely negative. Considering generalized pain and medial pain; DDx: Osteoarthritis, pes anserine bursitis, medial mensical injury, MCL damage although seems most consistent with overuse with possibly newly developing osteoarthritis. I believe that osteoarthritis is the most likely given the timing of pain, and starting his job, and without trauma. If outpatient treatment fails, will consider xray or referral to sports med. -Treat with leg strengthening exercises, Voltaren gel application -Follow up in 2 weeks  Diabetes mellitus (HCC) -continue current DM regimen -follow up in 1 month for repeat A1c  FOLLOW UP: Follow up in 2 weeks for recheck, consider x-ray and sports medicine referral at that time if no improvement in pain. Encouraged activity modifications as he is able with his very physically demanding occupation.   -PHQ-9 score of 0 reviewed.   Deatra Ina, Medical Student [facility]  I was personally present and performed or re-performed the history, physical exam and medical decision making activities of this service and have verified that the service and findings  are accurately documented in the student's note. My edits are noted within the note above. Please also see attending's attestation.   Reece Leader, DO                  07/01/2022, 8:48 AM  PGY-3, [facility]

## 2022-06-30 NOTE — Assessment & Plan Note (Signed)
-  continue current DM regimen -follow up in 1 month for repeat A1c

## 2022-07-05 ENCOUNTER — Other Ambulatory Visit: Payer: Self-pay | Admitting: Student

## 2022-07-05 DIAGNOSIS — E119 Type 2 diabetes mellitus without complications: Secondary | ICD-10-CM

## 2022-07-16 ENCOUNTER — Other Ambulatory Visit: Payer: Self-pay

## 2022-07-16 NOTE — Progress Notes (Signed)
Patient attempted to be outreached by Frederic Jericho, PharmD Candidate on 07/16/22 to discuss hypertension. Left voicemail for patient to return our call at their convenience at [phone].  Frederic Jericho, Student-PharmD

## 2022-07-17 ENCOUNTER — Ambulatory Visit (INDEPENDENT_AMBULATORY_CARE_PROVIDER_SITE_OTHER): Payer: BC Managed Care – PPO | Admitting: Family Medicine

## 2022-07-17 ENCOUNTER — Ambulatory Visit
Admission: RE | Admit: 2022-07-17 | Discharge: 2022-07-17 | Disposition: A | Discharge status: Home / Self Care | Payer: BC Managed Care – PPO | Source: Ambulatory Visit | Attending: Family Medicine | Admitting: Family Medicine

## 2022-07-17 VITALS — BP 136/72 | HR 74 | Ht 74.0 in | Wt 393.2 lb

## 2022-07-17 DIAGNOSIS — M25562 Pain in left knee: Secondary | ICD-10-CM | POA: Diagnosis not present

## 2022-07-17 DIAGNOSIS — M5430 Sciatica, unspecified side: Secondary | ICD-10-CM | POA: Insufficient documentation

## 2022-07-17 DIAGNOSIS — M5431 Sciatica, right side: Secondary | ICD-10-CM | POA: Diagnosis not present

## 2022-07-17 NOTE — Patient Instructions (Addendum)
It was great seeing you today!  Today we discussed your left knee pain, I am glad this is a little better but since it is still bothering you, I have ordered an x-ray of your left knee. Continue to apply the voltaren gel to the area and do the strengthening exercises. I have also placed a referral to the sports medicine clinic, please call  [phone] to schedule an appointment at your earliest convenience.   I encourage you to take rest whenever you are able to do so.  Regarding your right leg, this seems to be due to sciatic nerve pain as we discussed. The treatment for this is various stretching exercises. Attached are exercises for you to try, doing these 1-2 times daily can significantly improve the pain.   I have also placed a referral to physical therapy, they can help guide you better as well so a few sessions may help. They should be in touch to schedule a time within the next few weeks.   Please follow up at your next scheduled appointment in 1 month for a diabetes check up, if anything arises between now and then, please don't hesitate to contact our office.   Thank you for allowing [place] to be a part of your medical care!  Thank you, Dr. Robyne Peers

## 2022-07-17 NOTE — Assessment & Plan Note (Signed)
-  given continued pain, will obtain x-ray to assess for dislocation or gross abnormality -likely MSK etiology, possibly osteoarthritis. Also considered patellofemoral syndrome. Exacerbated by activity. -sports medicine referral placed, contact number given and advised to call to schedule visit at earliest convenience -PT referral placed -continue voltaren gel and leg strengthening exercises  -follow up as appropriate

## 2022-07-17 NOTE — Assessment & Plan Note (Addendum)
-  chronic and stable, continue current DM regimen -reassurance provided and congratulated patient on all his exceptional progress in improving his DM -follow up in 1 month for repeat A1c

## 2022-07-17 NOTE — Assessment & Plan Note (Signed)
-  pain from right extremity likely secondary to sciatica, reassuringly neurovascularly intact.  -sciatica rehab exercises handout provided -PT referral placed -reassurance provided -follow up in 1 month

## 2022-07-17 NOTE — Progress Notes (Addendum)
    SUBJECTIVE:   CHIEF COMPLAINT / HPI:   Patient presents for left knee pain follow up, says that the pain is a little better compared to the last visit but then started to have muscle pain in his right leg that started 4 days ago. He feels like it is an aching sensation. Pain starts along his right buttocks and goes to to his right foot. Sometimes gets a burning sensation with numbness. Aggravating factors include walking on it a lot and sitting on it. Stays very active at work and is on his feet a lot. He has tried the leg strengthening exercises at home as well without issues but still having pain on the left knee.   OBJECTIVE:   BP 136/72   Pulse 74   Ht 6\' 2"  (1.88 m)   Wt (!) 393 lb 3.2 oz (178.4 kg)   SpO2 98%   BMI 50.48 kg/m   General: Patient well-appearing, in no acute distress. Resp: normal work of breathing noted MSK: no gross deformity noted, no crepitus noted with ROM of knees bilaterally, no erythema or edema noted bilaterally, no point tenderness along both knees, full active ROM bilaterally, positive straight leg raise on the right, negative Lachman's bilaterally, negative anterior and posterior drawer testing bilaterally Neuro: 5/5 LE strength bilaterally, 2+ patellar and achilles reflexes bilaterally, gross sensation intact, normal gait  Psych: mood appropriate, very pleasant   ASSESSMENT/PLAN:   Left knee pain -given continued pain, will obtain x-ray to assess for dislocation or gross abnormality -likely MSK etiology, possibly osteoarthritis. Also considered patellofemoral syndrome. Exacerbated by activity. -sports medicine referral placed, contact number given and advised to call to schedule visit at earliest convenience -PT referral placed -continue voltaren gel and leg strengthening exercises  -follow up as appropriate   Sciatica -pain from right extremity likely secondary to sciatica, reassuringly neurovascularly intact.  -sciatica rehab exercises handout  provided -PT referral placed -reassurance provided -follow up in 1 month  Diabetes mellitus (HCC) -chronic and stable, continue current DM regimen -reassurance provided and congratulated patient on all his exceptional progress in improving his DM -follow up in 1 month for repeat A1c     Jaileigh Weimer Robyne Peers, DO [facility]

## 2022-07-21 ENCOUNTER — Ambulatory Visit (INDEPENDENT_AMBULATORY_CARE_PROVIDER_SITE_OTHER): Payer: BC Managed Care – PPO | Admitting: Sports Medicine

## 2022-07-21 ENCOUNTER — Ambulatory Visit
Admission: RE | Admit: 2022-07-21 | Discharge: 2022-07-21 | Disposition: A | Discharge status: Home / Self Care | Payer: BC Managed Care – PPO | Source: Ambulatory Visit | Attending: Sports Medicine | Admitting: Sports Medicine

## 2022-07-21 VITALS — BP 127/76 | Ht 74.0 in | Wt 392.0 lb

## 2022-07-21 DIAGNOSIS — M1712 Unilateral primary osteoarthritis, left knee: Secondary | ICD-10-CM | POA: Diagnosis not present

## 2022-07-21 DIAGNOSIS — M5416 Radiculopathy, lumbar region: Secondary | ICD-10-CM

## 2022-07-21 MED ORDER — METHYLPREDNISOLONE ACETATE 40 MG/ML IJ SUSP
80.0000 mg | Freq: Once | INTRAMUSCULAR | Status: AC
  Administered 2022-07-21: 80 mg via INTRAMUSCULAR

## 2022-07-21 NOTE — Progress Notes (Signed)
   Subjective:    Patient ID: Anthony Collier, male    DOB: 25-Apr-1975, 47 y.o.   MRN: [id number]  HPI chief complaint: Left knee and right leg pain  Patient is a very pleasant 47 year old male that comes in with a couple of different complaints.  First complaint is left knee pain that has been present for many months.  No trauma.  No prior knee surgery.  He describes pain in the medial aspect of the knee which is present both with sitting as well as with activity.  He denies any swelling.  He has tried multiple anti-inflammatories and muscle relaxers without any relief.  He has also purchased a copper fit compression sleeve but it has not been of any benefit.  His main complaint today is right lower back and leg pain that began about a week and a half ago without any known trauma.  Describes pain that radiates down the right leg into the bottom of the right foot.  He endorses weakness which has caused him to stumble.  Pain is worse with sitting but is also present with standing.  Denies similar issues in the past.  Denies groin pain.  Past medical history reviewed Medications reviewed Allergies reviewed    Review of Systems     Objective:   Physical Exam  Well-developed, well-nourished.  No acute distress  Left knee: Good range of motion.  No effusion.  1+ patellofemoral crepitus.  There is some tenderness to palpation along the medial joint line.  No tenderness laterally.  Knee is grossly stable to ligamentous exam.  Right leg: Positive straight leg raise.  He has an absent Achilles reflex on the right compared to 1-2+ on the left.  Weakness with plantarflexion on the right.  He is unable to stand on his tiptoes on the right.  No noticeable atrophy.  Decreased sensation to light touch along the S1 dermatome.  X-rays of his lumbar spine from 2020 show minimal degenerative changes at L3-L4.  Otherwise unremarkable. X-rays of the left knee including AP, lateral, sunrise, and 30 degree  flexion views shows moderately advanced medial compartmental narrowing, especially on the flexion view.      Assessment & Plan:   Right leg pain secondary to lumbar radiculopathy Left knee pain secondary to DJD  The weakness in his right foot is concerning.  I would like to get updated x-rays as well as an MRI of his lumbar spine specifically to rule out lumbar disc herniation which may be compressing the S1 nerve root.  I will follow-up with him with those results when available.  In the meantime, his PCP has ordered some physical therapy and I have encouraged him to start this ASAP.  We will also inject him with 80 mg of Depo-Medrol IM. For his knee osteoarthritis, I discussed future treatments could include cortisone injection, viscosupplementation, or referral to surgery to discuss total knee arthroplasty.  This note was dictated using Dragon naturally speaking software and may contain errors in syntax, spelling, or content which have not been identified prior to signing this note.   Addendum: X-rays reviewed.  There is some mild disc space narrowing at L5-S1.  Otherwise unremarkable.  Patient will proceed with MRI of the lumbar spine as scheduled.

## 2022-07-22 ENCOUNTER — Encounter: Payer: Self-pay | Admitting: Sports Medicine

## 2022-07-23 ENCOUNTER — Encounter: Payer: Self-pay | Admitting: Family Medicine

## 2022-07-23 ENCOUNTER — Ambulatory Visit
Admission: RE | Admit: 2022-07-23 | Discharge: 2022-07-23 | Disposition: A | Discharge status: Home / Self Care | Payer: BC Managed Care – PPO | Source: Ambulatory Visit | Attending: Sports Medicine | Admitting: Sports Medicine

## 2022-07-23 DIAGNOSIS — M5416 Radiculopathy, lumbar region: Secondary | ICD-10-CM

## 2022-07-23 DIAGNOSIS — M5431 Sciatica, right side: Secondary | ICD-10-CM

## 2022-07-23 MED ORDER — CYCLOBENZAPRINE HCL 10 MG PO TABS
10.0000 mg | ORAL_TABLET | Freq: Three times a day (TID) | ORAL | 0 refills | Status: DC | PRN
Start: 2022-07-23 — End: 2023-02-15

## 2022-07-24 ENCOUNTER — Other Ambulatory Visit: Payer: Self-pay | Admitting: Family Medicine

## 2022-07-24 DIAGNOSIS — E119 Type 2 diabetes mellitus without complications: Secondary | ICD-10-CM

## 2022-07-25 ENCOUNTER — Other Ambulatory Visit: Payer: Self-pay | Admitting: Family Medicine

## 2022-07-25 DIAGNOSIS — E781 Pure hyperglyceridemia: Secondary | ICD-10-CM

## 2022-08-04 ENCOUNTER — Other Ambulatory Visit: Payer: Self-pay

## 2022-08-04 DIAGNOSIS — M5416 Radiculopathy, lumbar region: Secondary | ICD-10-CM

## 2022-08-06 NOTE — Therapy (Signed)
OUTPATIENT PHYSICAL THERAPY EVALUATION   Patient Name: Anthony Collier MRN: [id number] DOB:[birth year], 47 y.o., male Today's Date: 08/07/2022   END OF SESSION:  PT End of Session - 08/07/22 1203     Visit Number 1    Number of Visits 7    Date for PT Re-Evaluation 09/18/22    Authorization Type BCBS    PT Start Time 1100    PT Stop Time 1145    PT Time Calculation (min) 45 min    Activity Tolerance Patient limited by pain    Behavior During Therapy WFL for tasks assessed/performed             Past Medical History:  Diagnosis Date   Diabetes mellitus without complication (HCC)    Hypertension    History reviewed. No pertinent surgical history. Patient Active Problem List   Diagnosis Date Noted   Sciatica 07/17/2022   Left knee pain 06/30/2022   Vertigo 11/14/2021   Mid back pain on right side 08/29/2021   Severe obstructive sleep apnea 05/07/2021   Essential hypertension 04/11/2021   Snoring 03/27/2021   Balanitis 03/07/2021   Hypertriglyceridemia 04/04/2020   Numbness of left lower extremity 09/23/2018   Obesity, morbid (HCC) 09/17/2017   Elevated BP without diagnosis of hypertension 06/11/2017   Diabetes mellitus (HCC) 03/31/2017    PCP: Nelia Shi, MD  REFERRING PROVIDER: Billey Co, MD  REFERRING DIAG: Sciatica of right side, Acute pain of left knee  Rationale for Evaluation and Treatment: Rehabilitation  THERAPY DIAG:  Pain in right leg  Muscle weakness (generalized)  ONSET DATE: 4 weeks   SUBJECTIVE:                                                                                                                                                                                          SUBJECTIVE STATEMENT: Patient reports sciatic nerve damage to his right left down to his foot. He had a MRI done and he has a ruptured disc that needs surgery. Pain started about 4 weeks ago and the only thing he can think of is that he does a lot of  walking so he started getting muscle cramps and aching. Pain runs from right buttocks to his heel. He is also getting numbness in his foot, and then when pain is bad will get a burning sensation. He also reports a popping in the back of the ankle when he walks but it doesn't hurt. He has a lot of trouble laying in bed and he has to toss and turn to get comfortable. He has to stand up because sitting aggravates the pain. Climbing stairs  is painful and difficulty, he lives in a 2 story home. He did have a shot in his right buttock that didn't work.   Patient notes left knee pain due to arthritis but that isn't bothering him as much as the right leg pain, and he will probably need a knee replacement at some point.  PERTINENT HISTORY:  See PMH above  PAIN:  Are you having pain? Yes:  NPRS scale: 8/10 (5/10 at best, 10/10 at worse) Pain location: Right buttock to heel, right foot numbness Pain description: Aching, numbness, burning Aggravating factors: Lying down, sitting, walking extended periods Relieving factors: Nothing  PRECAUTIONS: None  RED FLAGS: None   WEIGHT BEARING RESTRICTIONS: No  FALLS:  Has patient fallen in last 6 months? No  OCCUPATION: Landscaping for [employer]  PLOF: Independent  PATIENT GOALS: Pain relief and return to work   OBJECTIVE:  DIAGNOSTIC FINDINGS:  Lumbar MRI 07/23/2022: IMPRESSION: 1. At L5-S1 there is a broad-based disc bulge with a large right subarticular disc extrusion and mass effect on the right intraspinal S1 nerve root. Narrowing of the left lateral recess. Moderate spinal stenosis. Moderate bilateral facet arthropathy. Severe right foraminal stenosis. Moderate-severe left foraminal stenosis. 2. At L4-5 there is a mild broad-based disc bulge. Moderate bilateral facet arthropathy. Mild bilateral foraminal stenosis. 3. At L3-4 there is mild bilateral facet arthropathy. Mild bilateral foraminal stenosis, right greater than  left. 4.  No acute osseous injury of the lumbar spine.  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  PALPATION: Tenderness to palpation right gluteal and piriformis region  LUMBAR ROM:   AROM eval  Flexion Fingertips to knee  Extension 50%  Right lateral flexion 50%  Left lateral flexion 75%  Right rotation 50%  Left rotation 75%   (Blank rows = not tested)  Patient reports right leg pulling/pain with flexion, extension, and right sided movements  LOWER EXTREMITY ROM:      LE ROM grossly WFL  LOWER EXTREMITY MMT:    MMT Right eval Left eval  Hip flexion    Hip extension 3 4  Hip abduction 2 4  Hip adduction    Hip internal rotation    Hip external rotation    Knee flexion 4 5  Knee extension 4+ 5  Ankle dorsiflexion 5 5  Ankle plantarflexion    Ankle inversion    Ankle eversion     (Blank rows = not tested)  LUMBAR SPECIAL TESTS:  SLR positive on right  FUNCTIONAL TESTS:  Not assessed  GAIT: Assistive device utilized: None Level of assistance: Complete Independence Comments: Antalgic on right   TODAY'S TREATMENT:       [facility] Adult PT Treatment:                                                DATE: 08/07/2022 Therapeutic Exercise: Prone press up x 10 Side clamshell x 20 Seated sciatic nerve floss with heel on ground  x 10  PATIENT EDUCATION:  Education details: Exam findings, POC, HEP Person educated: Patient Education method: Explanation, Demonstration, Tactile cues, Verbal cues, and Handouts Education  comprehension: verbalized understanding, returned demonstration, verbal cues required, tactile cues required, and needs further education  HOME EXERCISE PROGRAM: Access Code: [email or web address]    ASSESSMENT: CLINICAL IMPRESSION: Patient is a 47 y.o. male who was seen today for physical therapy evaluation and treatment for chronic right posterior leg pain from the buttock to the heel with right foot numbness. He demonstrates limitations in his lumbar motion with preference for extension based movements. 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 His symptoms are consistent with disc pathology that has been confirmed with MRI. He does have an appointment with ortho to assess for surgical indication of lumbar spine on 08/12/2022, so will schedule follow-up appointments following that visit.   OBJECTIVE IMPAIRMENTS: Abnormal gait, decreased activity tolerance, decreased ROM, decreased strength, impaired flexibility, impaired sensation, postural dysfunction, and pain.   ACTIVITY LIMITATIONS: bending, sitting, standing, sleeping, stairs, and locomotion level  PARTICIPATION LIMITATIONS: meal prep, cleaning, driving, shopping, community activity, and occupation  PERSONAL FACTORS: Fitness, Past/current experiences, and Time since onset of injury/illness/exacerbation are also affecting patient's functional outcome.   REHAB POTENTIAL: Good  CLINICAL DECISION MAKING: Stable/uncomplicated  EVALUATION COMPLEXITY: Low   GOALS: Goals reviewed with patient? Yes  SHORT TERM GOALS: = LTG  LONG TERM GOALS: Target date: 09/18/2022  Patient will be I with final HEP to maintain progress from PT. Baseline: HEP provided at eval Goal status: INITIAL  2.  Patient will report >/= 61% status on FOTO to indicate improved functional ability. Baseline: 39% functional status Goal status: INITIAL  3.  Patient will report right  leg pain </= 4/10 in order to reduce functional limitations Baseline: 8/10 Goal status: INITIAL  4.  Patient will demonstrate lumbar motion improvement >/= 25% in all planes to improve his mobility Baseline: see limitations noted above Goal status: INITIAL  5. Patient will demonstrate right hip strength >/= 4-/5 MMT in order to improve walking tolerance  Baseline: see limitations noted above   Goal status: INITIAL   PLAN: PT FREQUENCY: 1x/week  PT DURATION: 6 weeks  PLANNED INTERVENTIONS: Therapeutic exercises, Therapeutic activity, Neuromuscular re-education, Balance training, Gait training, Patient/Family education, Self Care, Joint mobilization, Aquatic Therapy, Dry Needling, Electrical stimulation, Spinal manipulation, Spinal mobilization, Cryotherapy, Moist heat, Manual therapy, and Re-evaluation.  PLAN FOR NEXT SESSION: Review HEP and progress PRN, manual/TPDN for right gluteal and piriformis region, continue sciatic nerve floss, extension based exercises, progress core stabilization and hip strengthening as tolerated   Rosana Hoes, PT, DPT, LAT, ATC 08/07/22  12:14 PM Phone: [phone] Fax: [phone]

## 2022-08-07 ENCOUNTER — Other Ambulatory Visit: Payer: Self-pay

## 2022-08-07 ENCOUNTER — Ambulatory Visit: Payer: BC Managed Care – PPO | Attending: Family Medicine | Admitting: Physical Therapy

## 2022-08-07 ENCOUNTER — Encounter: Payer: Self-pay | Admitting: Physical Therapy

## 2022-08-07 DIAGNOSIS — M5431 Sciatica, right side: Secondary | ICD-10-CM | POA: Diagnosis not present

## 2022-08-07 DIAGNOSIS — M79604 Pain in right leg: Secondary | ICD-10-CM | POA: Diagnosis present

## 2022-08-07 DIAGNOSIS — M6281 Muscle weakness (generalized): Secondary | ICD-10-CM | POA: Insufficient documentation

## 2022-08-07 NOTE — Patient Instructions (Signed)
Access Code: [email or web address] URL: [email or web address] Date: 08/07/2022 Prepared by: Rosana Hoes  Exercises - Prone Press Up  - 4-5 x daily - 10 reps - Seated Hamstring Stretch  - 4-5 x daily - 10 reps - Clamshell  - 2 x daily - 20 reps

## 2022-08-10 ENCOUNTER — Other Ambulatory Visit: Payer: Self-pay

## 2022-08-10 DIAGNOSIS — I10 Essential (primary) hypertension: Secondary | ICD-10-CM

## 2022-08-10 MED ORDER — LOSARTAN POTASSIUM 25 MG PO TABS: 25.0000 mg | ORAL_TABLET | Freq: Every day | ORAL | 3 refills | Status: DC

## 2022-08-12 ENCOUNTER — Ambulatory Visit: Payer: BC Managed Care – PPO | Admitting: Orthopedic Surgery

## 2022-08-12 ENCOUNTER — Other Ambulatory Visit (INDEPENDENT_AMBULATORY_CARE_PROVIDER_SITE_OTHER): Payer: BC Managed Care – PPO

## 2022-08-12 ENCOUNTER — Ambulatory Visit: Payer: BC Managed Care – PPO | Admitting: Family Medicine

## 2022-08-12 VITALS — BP 135/89 | HR 71 | Ht 74.0 in | Wt 392.0 lb

## 2022-08-12 DIAGNOSIS — M545 Low back pain, unspecified: Secondary | ICD-10-CM

## 2022-08-12 DIAGNOSIS — M5416 Radiculopathy, lumbar region: Secondary | ICD-10-CM | POA: Diagnosis not present

## 2022-08-12 MED ORDER — PREGABALIN 75 MG PO CAPS: 75.0000 mg | ORAL_CAPSULE | Freq: Two times a day (BID) | ORAL | 0 refills | Status: DC

## 2022-08-12 NOTE — Progress Notes (Signed)
Orthopedic Spine Surgery Office Note  Assessment: Patient is a 47 y.o. male with low back pain that radiates into the right lower extremity in the S1 distribution.  Has a right paracentral disc herniation at L5/S1   Plan: -Explained that initially conservative treatment is tried as a significant number of patients may experience relief with these treatment modalities. Discussed that the conservative treatments include:  -activity modification  -physical therapy  -over the counter pain medications  -medrol dosepak  -lumbar steroid injections -Patient has tried PT, Tylenol, ibuprofen, gabapentin, intramuscular steroid injection -Explained that most disc herniations reabsorb over time and do not require surgery.  As long as he makes improvement over time, we will continue to treat nonoperatively and monitor.  Told him that if he fails to improve then surgery would be an option -Recommended L5/S1 transforaminal injection on the right -Discussed the fact that his BMI is 50 and his A1c is 8 which would both increase his risk of complication.  I told him to minimize his potential for complication if surgery is considered, he would need to at least get down to a BMI of 40 and have an A1c of 7.5 or less -Patient should return to office in 4 weeks, x-rays at next visit: None   Patient expressed understanding of the plan and all questions were answered to the patient's satisfaction.   ___________________________________________________________________________   History:  Patient is a 47 y.o. male who presents today for lumbar spine.  Patient has had 5 weeks of low back pain that radiates into the right lower extremity.  He feels it along the posterior aspect of the right leg.  It goes into the right buttock, posterior thigh, right posterior leg.  He is not having any pain radiating into the left lower extremity.  There is no trauma or injury that preceded the onset of pain.  Pain has been stable since  onset.  He feels it on a daily basis.  He has been unable to work due to the pain.   Weakness: Yes, right calf feels weaker.  No other weakness noted Symptoms of imbalance: Yes, from his vertigo.  No other issues with balance Paresthesias and numbness: Denies Bowel or bladder incontinence: Denies Saddle anesthesia: Denies  Treatments tried: PT, Tylenol, ibuprofen, gabapentin, intramuscular steroid injection  Review of systems: Denies fevers and chills, night sweats, unexplained weight loss, history of cancer.  Has had pain that wakes him at night  Past medical history: HLD HTN OSA Vertigo DM (last A1c was 8.0 on 05/13/2022)  Allergies: NKDA  Past surgical history:  None  Social history: Denies use of nicotine product (smoking, vaping, patches, smokeless) Alcohol use: Denies Denies recreational drug use   Physical Exam:  General: no acute distress, appears stated age Neurologic: alert, answering questions appropriately, following commands Respiratory: unlabored breathing on room air, symmetric chest rise Psychiatric: appropriate affect, normal cadence to speech   MSK (spine):  -Strength exam      Left  Right EHL    5/5  5/5 TA    5/5  5/5 GSC    5/5  5/5 Knee extension  5/5  5/5 Hip flexion   5/5  5/5  -Sensory exam    Sensation intact to light touch in L3-S1 nerve distributions of bilateral lower extremities  -Achilles DTR: 2/4 on the left, 1/4 on the right -Patellar tendon DTR: 2/4 on the left, 2/4 on the right  -Straight leg raise: positive on the right, negative on the left -Femoral  nerve stretch test: negative bilaterally  -Clonus: no beats bilaterally  -Left hip exam: No pain through range of motion, negative Stinchfield -Right hip exam: No pain through range of motion, negative Stinchfield  Imaging: XR of the lumbar spine from 08/12/2022 was independently reviewed and interpreted, showing no fracture or dislocation.  No significant degenerative  changes.  No evidence of instability on flexion/extension views.  MRI of the lumbar spine from 07/23/2022 was independently reviewed and interpreted, showing right paracentral disc herniation at L5/S1 causing lateral recess and central stenosis.  Foraminal stenosis on the right at L5/S1.  No other significant stenosis seen.   Patient name: Anthony Collier Patient MRN: [id number] Date of visit: 08/12/22

## 2022-08-13 ENCOUNTER — Ambulatory Visit: Payer: BC Managed Care – PPO | Admitting: Family Medicine

## 2022-08-14 ENCOUNTER — Telehealth: Payer: Self-pay | Admitting: Orthopedic Surgery

## 2022-08-14 NOTE — Telephone Encounter (Signed)
Patient has submitted STD forms. "Unable to work" is in the ov note from 08/12/22. Please provide oow note with duration. Thanks!

## 2022-08-17 ENCOUNTER — Encounter: Payer: Self-pay | Admitting: Radiology

## 2022-08-17 ENCOUNTER — Telehealth: Payer: Self-pay | Admitting: Orthopedic Surgery

## 2022-08-17 MED ORDER — DULOXETINE HCL 20 MG PO CPEP
20.0000 mg | ORAL_CAPSULE | Freq: Every day | ORAL | 0 refills | Status: AC
Start: 2022-08-17 — End: ?

## 2022-08-17 NOTE — Telephone Encounter (Signed)
Patient called. Says he needs another RX in the place of Lyrica. His insurance does not cover. [phone]

## 2022-08-17 NOTE — Telephone Encounter (Signed)
I called and he is fine with you sending in the Cymbalta to his pharmacy

## 2022-08-17 NOTE — Telephone Encounter (Signed)
Noted for [employer]. 

## 2022-08-24 ENCOUNTER — Ambulatory Visit (INDEPENDENT_AMBULATORY_CARE_PROVIDER_SITE_OTHER): Payer: BC Managed Care – PPO | Admitting: Family Medicine

## 2022-08-24 ENCOUNTER — Encounter: Payer: Self-pay | Admitting: Family Medicine

## 2022-08-24 ENCOUNTER — Telehealth: Payer: Self-pay

## 2022-08-24 ENCOUNTER — Other Ambulatory Visit (HOSPITAL_COMMUNITY): Payer: Self-pay

## 2022-08-24 VITALS — BP 132/80 | HR 70 | Ht 74.0 in | Wt 380.1 lb

## 2022-08-24 DIAGNOSIS — E119 Type 2 diabetes mellitus without complications: Secondary | ICD-10-CM

## 2022-08-24 DIAGNOSIS — M5431 Sciatica, right side: Secondary | ICD-10-CM

## 2022-08-24 DIAGNOSIS — Z794 Long term (current) use of insulin: Secondary | ICD-10-CM

## 2022-08-24 LAB — POCT GLYCOSYLATED HEMOGLOBIN (HGB A1C): HbA1c, POC (controlled diabetic range): 10.8 % — AB (ref 0.0–7.0)

## 2022-08-24 MED ORDER — SEMAGLUTIDE(0.25 OR 0.5MG/DOS) 2 MG/1.5ML ~~LOC~~ SOPN
0.2500 mg | PEN_INJECTOR | SUBCUTANEOUS | 3 refills | Status: DC
Start: 2022-08-24 — End: 2022-08-24

## 2022-08-24 MED ORDER — SEMAGLUTIDE(0.25 OR 0.5MG/DOS) 2 MG/1.5ML ~~LOC~~ SOPN
0.2500 mg | PEN_INJECTOR | SUBCUTANEOUS | 3 refills | Status: DC
Start: 2022-08-24 — End: 2022-08-26

## 2022-08-24 MED ORDER — METFORMIN HCL ER 500 MG PO TB24
1000.0000 mg | ORAL_TABLET | Freq: Every day | ORAL | 5 refills | Status: DC
Start: 2022-08-24 — End: 2023-02-15

## 2022-08-24 MED ORDER — TRAMADOL HCL 50 MG PO TABS
50.0000 mg | ORAL_TABLET | Freq: Three times a day (TID) | ORAL | 0 refills | Status: AC | PRN
Start: 2022-08-24 — End: 2022-08-29

## 2022-08-24 MED ORDER — DEXCOM G7 SENSOR MISC
1.0000 | 0 refills | Status: AC
Start: 2022-08-24 — End: ?

## 2022-08-24 NOTE — Telephone Encounter (Signed)
Patient calls nurse line requesting that Ozempic be sent to [facility] on [address].   Canceled rx at [facility], resent to [facility] on [address].   Called [facility]. They report that PA is needed for Ozempic.   Attempted to complete PA via Covermymeds. Received notification stating that PA is resolved.   Durward Mallard- are you able to run a test claim to provide further clarification?   Veronda Prude, RN

## 2022-08-24 NOTE — Telephone Encounter (Signed)
Pharmacy Patient Advocate Encounter   PA required; PA submitted to [employer] via CoverMyMeds Key/confirmation #/EOC [id number] Status is pending

## 2022-08-24 NOTE — Patient Instructions (Signed)
It was great to see you today! Thank you for choosing [facility] for your primary care.  Today we addressed: 1. Type 2 diabetes: Your A1c is up to 10.8 today, but we will get you started on Ozempic and increase your metformin to 1000 mg daily.  I am also starting you on a continuous glucose monitor (Dexcom G7).  Come back to see Dr. Raymondo Band at the clinic in a few days to get started on that.  2. Sciatica of right side Prescribing 5 days of tramadol, 50 mg up to three times a day.  You can take it twice daily to make it last longer.  Be careful if taking tramadol and duloxetine together.  We are checking some labs today, including BMP, kidney labs.  You will get a MyChart message or a letter if results are normal. Otherwise, you will get a call from [place].  You should return to our clinic in 1-2 months.  Thank you for coming to see [place] at [facility] and for the opportunity to care for you! Skyy Mcknight, MD 08/24/2022, 2:27 PM

## 2022-08-24 NOTE — Assessment & Plan Note (Addendum)
Poorly controlled today, A1c 10.8.  Current insulin and metformin regimen is insufficient.  Patient previously responded to Ozempic, needs new therapy at this time. - Increased metformin to 1000 mg daily - BMP, urine microalbumin/creatinine ratio - Ophtho appointment next week - Ozempic weekly, start at 0.25>0.5>1mg , increasing dose every 4 weeks - Dexcom G7 CGM device ordered with referral to Dr. Raymondo Band

## 2022-08-24 NOTE — Progress Notes (Signed)
SUBJECTIVE:   CHIEF COMPLAINT / HPI:  Anthony Collier is a 47 y.o. male with a pertinent past medical history of T2DM on insulin, OSA, HTN, sciatica with lumbar disc rupture presenting to the clinic for sciatica and T2DM follow-up.  Sciatica, lumbar disc rupture Lumbar disc rupture on MRI 6/27, following with ortho and appointment scheduled 8/7. R foot numb since mid-June, endorses shooting pains. Numbness is from back of half to right dorsal and plantar surfaces of foot. Pain in back and leg are significant, interfere with sleep. 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 Had a lumbar muscular injury a few years ago, feels like issues started then. Tried gabapentin, pregabalin, Flexeril, duloxetine, lidocaine patches, Voltaren gel, NSAIDs without relief. Denies loss of control of urinary and bowel functions.  Numbness not worsening, stable, restricted to RLE.  Diabetes Last A1c 8.0 in April 2024 Has been sedentary since June due to sciatica Home CBGs: ~200 in AM fasting Medications: Metformin 500 mg, Tresiba 10 units daily Adherence: Good adherence Eye exam: One coming up in the next month Foot exam: UTD Microalbumin: Checking today Statin: Atorvastatin 10 mg No symptoms of hypoglycemia, polydipsia, foot ulcers/trauma. 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   OBJECTIVE:   BP 132/80   Pulse 70   Ht 6\' 2"  (1.88 m)   Wt (!) 380 lb 2 oz (172.4 kg)   SpO2 99%   BMI 48.81 kg/m   General: Age-appropriate, overweight male, resting uncomfortably in chair, NAD, alert and at baseline. Cardiovascular: Regular rate and rhythm. Normal S1/S2. No murmurs, rubs, or gallops appreciated. 2+ radial pulses. Skin: Warm and dry. MSK: Gait with limp on R side.  Patellar  reflex intact bilaterally. Extremities: No peripheral edema bilaterally. No rashes or lesions on feet or legs bilaterally.  Capillary refill <2 seconds bilaterally.  Numb to palpation on lateral half of R foot on dorsal and plantar surfaces as well as on posterior calf.  No pain with passive ROM.  Results for orders placed or performed in visit on 08/24/22 (from the past 48 hour(s))  HgB A1c     Status: Abnormal   Collection Time: 08/24/22  1:35 PM  Result Value Ref Range   Hemoglobin A1C     HbA1c POC (<> result, manual entry)     HbA1c, POC (prediabetic range)     HbA1c, POC (controlled diabetic range) 10.8 (A) 0.0 - 7.0 %    ASSESSMENT/PLAN:   Problem List Items Addressed This Visit       Endocrine   Diabetes mellitus (HCC) - Primary    Poorly controlled today, A1c 10.8.  Current insulin and metformin regimen is insufficient.  Patient previously responded to Ozempic, needs new therapy at this time. - Increased metformin to 1000 mg daily - BMP, urine microalbumin/creatinine ratio - Ophtho appointment next week - Ozempic weekly, start at 0.25>0.5>1mg , increasing dose every 4 weeks - Dexcom G7 CGM device ordered with referral to Dr. Raymondo Band      Relevant Medications   metFORMIN (GLUCOPHAGE-XR) 500 MG 24 hr tablet   Continuous Glucose Sensor (DEXCOM G7 SENSOR) MISC   Other Relevant Orders   HgB A1c (Completed)   Microalbumin/Creatinine Ratio, Urine   Basic Metabolic Panel     Nervous and Auditory  Sciatica    Clear sciatic nerve involvement.  In the setting of lumbar disc herniation and unsuccessful attempts to control symptoms with multiple different treatments, short course of tramadol is appropriate leading up to patient's steroid injection next week with orthopedic surgery.  Will reevaluate after steroid injection to determine best course of action. - Tramadol 50 mg up to 3 times daily for 5 days      Relevant Medications   traMADol (ULTRAM) 50 MG tablet    Return in  about 2 months (around 10/25/2022) for Diabetes and sciatica check.  Anthony Toral Sharion Dove, MD [facility]

## 2022-08-24 NOTE — Assessment & Plan Note (Signed)
Clear sciatic nerve involvement.  In the setting of lumbar disc herniation and unsuccessful attempts to control symptoms with multiple different treatments, short course of tramadol is appropriate leading up to patient's steroid injection next week with orthopedic surgery.  Will reevaluate after steroid injection to determine best course of action. - Tramadol 50 mg up to 3 times daily for 5 days

## 2022-08-25 ENCOUNTER — Ambulatory Visit: Payer: BC Managed Care – PPO | Admitting: Pharmacist

## 2022-08-26 MED ORDER — TRULICITY 0.75 MG/0.5ML ~~LOC~~ SOAJ
SUBCUTANEOUS | 0 refills | Status: DC
Start: 2022-08-26 — End: 2022-09-01

## 2022-08-26 NOTE — Telephone Encounter (Signed)
Called patient to notify him that his insurance denied PA for Ozempic, will prescribe Trulicity instead. Discussed ramping dose after 4 weeks, addressed side effects and instructed patient to stop medication and call if he experiences adverse effects. Recommended movement to preserve muscle mass.

## 2022-08-31 ENCOUNTER — Ambulatory Visit: Payer: BC Managed Care – PPO | Admitting: Physical Therapy

## 2022-09-01 MED ORDER — TRULICITY 0.75 MG/0.5ML ~~LOC~~ SOAJ
SUBCUTANEOUS | 0 refills | Status: DC
Start: 2022-09-01 — End: 2022-09-02

## 2022-09-02 ENCOUNTER — Telehealth: Payer: Self-pay | Admitting: Physical Medicine and Rehabilitation

## 2022-09-02 ENCOUNTER — Other Ambulatory Visit: Payer: Self-pay | Admitting: Family Medicine

## 2022-09-02 ENCOUNTER — Encounter: Payer: BC Managed Care – PPO | Admitting: Physical Medicine and Rehabilitation

## 2022-09-02 MED ORDER — DULAGLUTIDE 1.5 MG/0.5ML ~~LOC~~ SOAJ
1.5000 mg | SUBCUTANEOUS | 1 refills | Status: DC
Start: 2022-09-02 — End: 2022-10-26

## 2022-09-02 NOTE — Telephone Encounter (Signed)
Spoke with patient and rescheduled injection for 09/24/22. Patient aware driver needed

## 2022-09-02 NOTE — Progress Notes (Signed)
Sent prescription for 1 month of 1.5 mg Trulicity with 1 refill, discontinued 0.75 mg.

## 2022-09-02 NOTE — Telephone Encounter (Signed)
Patient called needing to r/s appointment for the wk of 09/21/2022. The number to contact patient is [phone]

## 2022-09-04 NOTE — Therapy (Addendum)
OUTPATIENT PHYSICAL THERAPY TREATMENT  DISCHARGE   Patient Name: Anthony Collier MRN: [id number] DOB:[birth year], 47 y.o., male Today's Date: 09/07/2022   END OF SESSION:  PT End of Session - 09/07/22 0859     Visit Number 2    Number of Visits 7    Date for PT Re-Evaluation 09/18/22    Authorization Type [employer]    PT Start Time 0803    PT Stop Time 0845    PT Time Calculation (min) 42 min    Activity Tolerance Patient tolerated treatment well    Behavior During Therapy WFL for tasks assessed/performed              Past Medical History:  Diagnosis Date   Diabetes mellitus without complication (HCC)    Hypertension    History reviewed. No pertinent surgical history. Patient Active Problem List   Diagnosis Date Noted   Sciatica 07/17/2022   Left knee pain 06/30/2022   Vertigo 11/14/2021   Mid back pain on right side 08/29/2021   Severe obstructive sleep apnea 05/07/2021   Essential hypertension 04/11/2021   Snoring 03/27/2021   Balanitis 03/07/2021   Hypertriglyceridemia 04/04/2020   Numbness of left lower extremity 09/23/2018   Obesity, morbid (HCC) 09/17/2017   Elevated BP without diagnosis of hypertension 06/11/2017   Diabetes mellitus (HCC) 03/31/2017    PCP: Nelia Shi, MD  REFERRING PROVIDER: Billey Co, MD  REFERRING DIAG: Sciatica of right side, Acute pain of left knee  Rationale for Evaluation and Treatment: Rehabilitation  THERAPY DIAG:  Pain in right leg  Muscle weakness (generalized)   SUBJECTIVE:                                                                                                                                                                                          SUBJECTIVE STATEMENT: Patient reports continued numbness and muscle weakness of the right leg. Numbness is all the way down the back of the leg from the buttock to the heel. He is scheduled for an epidural injection on 09/24/2022.  PAIN:  Are you  having pain? Yes:  NPRS scale: 8/10 (5/10 at best, 10/10 at worse) Pain location: Right buttock to heel, right foot numbness Pain description: Aching, numbness, burning Aggravating factors: Lying down, sitting, walking extended periods Relieving factors: Nothing  PERTINENT HISTORY: See PMH above  PRECAUTIONS: None  PATIENT GOALS: Pain relief and return to work   OBJECTIVE:  DIAGNOSTIC FINDINGS:  Lumbar MRI 07/23/2022: IMPRESSION: 1. At L5-S1 there is a broad-based disc bulge with a large right subarticular disc extrusion and mass effect on the right intraspinal S1 nerve root.  Narrowing of the left lateral recess. Moderate spinal stenosis. Moderate bilateral facet arthropathy. Severe right foraminal stenosis. Moderate-severe left foraminal stenosis. 2. At L4-5 there is a mild broad-based disc bulge. Moderate bilateral facet arthropathy. Mild bilateral foraminal stenosis. 3. At L3-4 there is mild bilateral facet arthropathy. Mild bilateral foraminal stenosis, right greater than left. 4.  No acute osseous injury of the lumbar spine.  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  PALPATION: Tenderness to palpation right gluteal and piriformis region  LUMBAR ROM:   AROM eval  Flexion Fingertips to knee  Extension 50%  Right lateral flexion 50%  Left lateral flexion 75%  Right rotation 50%  Left rotation 75%   (Blank rows = not tested)  Patient reports right leg pulling/pain with flexion, extension, and right sided movements  LOWER EXTREMITY ROM:      LE ROM grossly WFL  LOWER EXTREMITY MMT:    MMT Right eval Left eval  Hip flexion    Hip  extension 3 4  Hip abduction 2 4  Hip adduction    Hip internal rotation    Hip external rotation    Knee flexion 4 5  Knee extension 4+ 5  Ankle dorsiflexion 5 5  Ankle plantarflexion    Ankle inversion    Ankle eversion     (Blank rows = not tested)  LUMBAR SPECIAL TESTS:  SLR positive on right  FUNCTIONAL TESTS:  Not assessed  GAIT: Assistive device utilized: None Level of assistance: Complete Independence Comments: Antalgic on right   TODAY'S TREATMENT:       [facility] Adult PT Treatment:                                                DATE: 09/07/2022 Therapeutic Exercise: Prone press up x 10 Seated hamstring stretch 3 x 30 sec on right Supine piriformis stretch 3 x 20 sec on right Hooklying sciatic nerve floss x 10 on right Hooklying clamshell with green 2 x 10 Bridge 10 x 5 sec Manual: Skilled palpation and monitoring of muscle tension while performing TPDN STM and IASTM for right gluteal/piriformis region and hamstring Trigger Point Dry Needling Treatment: Pre-treatment instruction: Patient instructed on dry needling rationale, procedures, and possible side effects including pain during treatment (achy,cramping feeling), bruising, drop of blood, lightheadedness, nausea, sweating. Patient Consent Given: Yes Education handout provided: No Muscles treated: Right glute max and piriformis, right lateral hamstring  Needle size and number: .30x74mm x 2 and .40x137mm x 2 Electrical stimulation performed: No Parameters: N/A Treatment response/outcome: Twitch response elicited and Palpable decrease in muscle tension Post-treatment instructions: Patient instructed to expect possible mild to moderate muscle soreness later today and/or tomorrow. Patient instructed in methods to reduce muscle soreness and to continue prescribed HEP. If patient was dry needled over the lung field, patient was instructed on signs and symptoms of pneumothorax and, however unlikely, to see immediate  medical attention should they occur. Patient was also educated on signs and symptoms of infection and to seek medical attention should they occur. Patient verbalized understanding of  these instructions and education.   [facility] Adult PT Treatment:                                                DATE: 08/07/2022 Therapeutic Exercise: Prone press up x 10 Side clamshell x 20 Seated sciatic nerve floss with heel on ground x 10  PATIENT EDUCATION:  Education details: HEP, TPDN Person educated: Patient Education method: Explanation, Demonstration, Tactile cues, Verbal cues Education comprehension: verbalized understanding, returned demonstration, verbal cues required, tactile cues required, and needs further education  HOME EXERCISE PROGRAM: Access Code: [email or web address]    ASSESSMENT: CLINICAL IMPRESSION: Patient tolerated therapy well with no adverse effects. He continues to report persistent numbness of right posterior leg. Performed TPDN for the right gluteal/piriformis region and hamstring as patient did report concordant tenderness and palpable increase in muscle tension. He did not report any centralization with repeated prone press ups. He does report feeling concordant symptoms with sciatic nerve flossing exercise. No changes made to HEP this visit. Patient would benefit from continued skilled PT to progress his mobility and strength in order to reduce pain and maximize functional ability.   OBJECTIVE IMPAIRMENTS: Abnormal gait, decreased activity tolerance, decreased ROM, decreased strength, impaired flexibility, impaired sensation, postural dysfunction, and pain.   ACTIVITY LIMITATIONS: bending, sitting, standing, sleeping, stairs, and locomotion level  PARTICIPATION LIMITATIONS: meal prep, cleaning, driving, shopping, community activity, and occupation  PERSONAL FACTORS: Fitness, Past/current experiences, and Time since onset of injury/illness/exacerbation are also affecting patient's  functional outcome.    GOALS: Goals reviewed with patient? Yes  SHORT TERM GOALS: = LTG  LONG TERM GOALS: Target date: 09/18/2022  Patient will be I with final HEP to maintain progress from PT. Baseline: HEP provided at eval Goal status: INITIAL  2.  Patient will report >/= 61% status on FOTO to indicate improved functional ability. Baseline: 39% functional status Goal status: INITIAL  3.  Patient will report right leg pain </= 4/10 in order to reduce functional limitations Baseline: 8/10 Goal status: INITIAL  4.  Patient will demonstrate lumbar motion improvement >/= 25% in all planes to improve his mobility Baseline: see limitations noted above Goal status: INITIAL  5. Patient will demonstrate right hip strength >/= 4-/5 MMT in order to improve walking tolerance  Baseline: see limitations noted above   Goal status: INITIAL   PLAN: PT FREQUENCY: 1x/week  PT DURATION: 6 weeks  PLANNED INTERVENTIONS: Therapeutic exercises, Therapeutic activity, Neuromuscular re-education, Balance training, Gait training, Patient/Family education, Self Care, Joint mobilization, Aquatic Therapy, Dry Needling, Electrical stimulation, Spinal manipulation, Spinal mobilization, Cryotherapy, Moist heat, Manual therapy, and Re-evaluation.  PLAN FOR NEXT SESSION: Review HEP and progress PRN, manual/TPDN for right gluteal and piriformis region, continue sciatic nerve floss, extension based exercises, progress core stabilization and hip strengthening as tolerated   Rosana Hoes, PT, DPT, LAT, ATC 09/07/22  9:00 AM Phone: [phone] Fax: [phone]   PHYSICAL THERAPY DISCHARGE SUMMARY  Visits from Start of Care: 2  Current functional level related to goals / functional outcomes: See above   Remaining deficits: See above   Education / Equipment: HEP   Patient agrees to discharge. Patient goals were not met. Patient is being discharged due to not returning since the last  visit.  Rosana Hoes, PT, DPT, LAT, ATC 11/16/22  1:11 PM Phone: [phone] Fax: [phone]

## 2022-09-07 ENCOUNTER — Other Ambulatory Visit: Payer: Self-pay | Admitting: Family Medicine

## 2022-09-07 ENCOUNTER — Ambulatory Visit: Payer: BC Managed Care – PPO | Attending: Family Medicine | Admitting: Physical Therapy

## 2022-09-07 ENCOUNTER — Other Ambulatory Visit: Payer: Self-pay

## 2022-09-07 ENCOUNTER — Encounter: Payer: Self-pay | Admitting: Physical Therapy

## 2022-09-07 DIAGNOSIS — M79604 Pain in right leg: Secondary | ICD-10-CM | POA: Insufficient documentation

## 2022-09-07 DIAGNOSIS — M6281 Muscle weakness (generalized): Secondary | ICD-10-CM | POA: Insufficient documentation

## 2022-09-07 DIAGNOSIS — Z794 Long term (current) use of insulin: Secondary | ICD-10-CM

## 2022-09-09 ENCOUNTER — Ambulatory Visit: Payer: BC Managed Care – PPO | Admitting: Orthopedic Surgery

## 2022-09-09 DIAGNOSIS — M5126 Other intervertebral disc displacement, lumbar region: Secondary | ICD-10-CM | POA: Diagnosis not present

## 2022-09-09 NOTE — Progress Notes (Signed)
Orthopedic Spine Surgery Office Note   Assessment: Patient is a 47 y.o. male with low back pain that radiates into the right lower extremity in the S1 distribution.  Has a right paracentral disc herniation at L5/S1     Plan: -Patient has tried PT, Tylenol, ibuprofen, gabapentin, intramuscular steroid injection, tramadol -Patient had to reschedule his lumbar steroid injection.  Encouraged him to have this injection done to help with the pain -Reiterated the fact that I would want his BMI to 40 or less and an A1c of 7.5 or less prior to any surgical intervention to minimize his chance of complication -He is currently working on weight loss -Patient should return to office in 6 weeks, x-rays at next visit: None     Patient expressed understanding of the plan and all questions were answered to the patient's satisfaction.    ___________________________________________________________________________     History:   Patient is a 47 y.o. male who presents today for follow-up on his lumbar spine.  His pain started back in June 2024.  He has not noticed any change in his symptoms since the last time he was seen.  He still having low back pain that radiates into the right lower extremity.  He feels it along the posterior aspect of the thigh and leg.  There is no pain radiating into the left lower extremity.  He gets numbness and paresthesias in the same distribution as the pain.  He feels the pain on a daily basis.  He is still unable to work due to the pain.   Treatments tried: PT, Tylenol, ibuprofen, gabapentin, intramuscular steroid injection, tramadol     Physical Exam:   General: no acute distress, appears stated age Neurologic: alert, answering questions appropriately, following commands Respiratory: unlabored breathing on room air, symmetric chest rise Psychiatric: appropriate affect, normal cadence to speech     MSK (spine):   -Strength exam                                                    Left                  Right EHL                              5/5                  4+/5 TA                                 5/5                  5/5 GSC                             5/5                  4+/5 Knee extension            5/5                  5/5 Hip flexion                    5/5  5/5   -Sensory exam                           Sensation intact to light touch in L3-S1 nerve distributions of bilateral lower extremities (decreased in S1 distribution on the right)   -Achilles DTR: 2/4 on the left, 1/4 on the right -Patellar tendon DTR: 2/4 on the left, 2/4 on the right   -Straight leg raise: positive on the right, negative on the left -Femoral nerve stretch test: negative bilaterally  -Clonus: no beats bilaterally   -Left hip exam: No pain through range of motion, negative Stinchfield -Right hip exam: No pain through range of motion, negative Stinchfield   Imaging: XRs of the lumbar spine from 08/12/2022 were previously independently reviewed and interpreted, showing no fracture or dislocation.  No significant degenerative changes.  No evidence of instability on flexion/extension views.   MRI of the lumbar spine from 07/23/2022 was previously independently reviewed and interpreted, showing right paracentral disc herniation at L5/S1 causing lateral recess and central stenosis.  Foraminal stenosis on the right at L5/S1.  No other significant stenosis seen.     Patient name: Anthony Collier Patient MRN: [id number] Date of visit: 09/09/22

## 2022-09-16 ENCOUNTER — Ambulatory Visit: Payer: BC Managed Care – PPO | Admitting: Physical Therapy

## 2022-09-23 ENCOUNTER — Ambulatory Visit: Payer: BC Managed Care – PPO | Admitting: Physical Therapy

## 2022-09-24 ENCOUNTER — Ambulatory Visit (INDEPENDENT_AMBULATORY_CARE_PROVIDER_SITE_OTHER): Payer: BC Managed Care – PPO | Admitting: Physical Medicine and Rehabilitation

## 2022-09-24 ENCOUNTER — Other Ambulatory Visit: Payer: Self-pay

## 2022-09-24 DIAGNOSIS — M5416 Radiculopathy, lumbar region: Secondary | ICD-10-CM | POA: Diagnosis not present

## 2022-09-24 DIAGNOSIS — M5116 Intervertebral disc disorders with radiculopathy, lumbar region: Secondary | ICD-10-CM

## 2022-09-24 MED ORDER — METHYLPREDNISOLONE ACETATE 80 MG/ML IJ SUSP
80.0000 mg | Freq: Once | INTRAMUSCULAR | Status: AC
Start: 2022-09-24 — End: 2022-09-24
  Administered 2022-09-24: 80 mg

## 2022-09-24 NOTE — Patient Instructions (Signed)

## 2022-09-24 NOTE — Progress Notes (Signed)
Functional Pain Scale - descriptive words and definitions  Distracting (5)    Aware of pain/able to complete some ADL's but limited by pain/sleep is affected and active distractions are only slightly useful. Moderate range order  Average Pain 5  Doing a lot of walking causes a lot of pain or sitting for a long period of time.   +Driver, -BT, -Dye Allergies.

## 2022-09-26 NOTE — Progress Notes (Signed)
Anthony Collier - 47 y.o. male MRN [id number]  Date of birth: 1975/08/18  Office Visit Note: Visit Date: 09/24/2022 PCP: Nelia Shi, MD Referred by: London Sheer, MD  Subjective: Chief Complaint  Patient presents with   Middle Back - Pain   HPI:  Anthony Collier is a 47 y.o. male who comes in today at the request of Dr. Willia Craze for planned Right S1-2 Lumbar Transforaminal epidural steroid injection with fluoroscopic guidance.  The patient has failed conservative care including home exercise, medications, time and activity modification.  This injection will be diagnostic and hopefully therapeutic.  Please see requesting physician notes for further details and justification.  Classic S1 dermatome.   ROS Otherwise per HPI.  Assessment & Plan: Visit Diagnoses:    ICD-10-CM   1. Lumbar radiculopathy  M54.16 XR C-ARM NO REPORT    Epidural Steroid injection    methylPREDNISolone acetate (DEPO-MEDROL) injection 80 mg    2. Radiculopathy due to lumbar intervertebral disc disorder  M51.16       Plan: No additional findings.   Meds & Orders:  Meds ordered this encounter  Medications   methylPREDNISolone acetate (DEPO-MEDROL) injection 80 mg    Orders Placed This Encounter  Procedures   XR C-ARM NO REPORT   Epidural Steroid injection    Follow-up: Return for visit to requesting provider as needed.   Procedures: No procedures performed  S1 Lumbosacral Transforaminal Epidural Steroid Injection - Sub-Pedicular Approach with Fluoroscopic Guidance   Patient: Anthony Collier      Date of Birth: 1975/08/15 MRN: [id number] PCP: Nelia Shi, MD      Visit Date: 09/24/2022   Universal Protocol:    Date/Time: 08/31/245:47 PM  Consent Given By: the patient  Position:  PRONE  Additional Comments: Vital signs were monitored before and after the procedure. Patient was prepped and draped in the usual sterile fashion. The correct patient, procedure, and  site was verified.   Injection Procedure Details:  Procedure Site One Meds Administered:  Meds ordered this encounter  Medications   methylPREDNISolone acetate (DEPO-MEDROL) injection 80 mg    Laterality: Right  Location/Site:  S1 Foramen   Needle size: 22 ga.  Needle type: Spinal  Needle Placement: Transforaminal  Findings:   -Comments: Excellent flow of contrast along the nerve, nerve root and into the epidural space.  Epidurogram: Contrast epidurogram showed no nerve root cut off or restricted flow pattern.  Procedure Details: After squaring off the sacral end-plate to get a true AP view, the C-arm was positioned so that the best possible view of the S1 foramen was visualized. The soft tissues overlying this structure were infiltrated with 2-3 ml. of 1% Lidocaine without Epinephrine.    The spinal needle was inserted toward the target using a "trajectory" view along the fluoroscope beam.  Under AP and lateral visualization, the needle was advanced so it did not puncture dura. Biplanar projections were used to confirm position. Aspiration was confirmed to be negative for CSF and/or blood. A 1-2 ml. volume of Isovue-250 was injected and flow of contrast was noted at each level. Radiographs were obtained for documentation purposes.   After attaining the desired flow of contrast documented above, a 0.5 to 1.0 ml test dose of 0.25% Marcaine was injected into each respective transforaminal space.  The patient was observed for 90 seconds post injection.  After no sensory deficits were reported, and normal lower extremity motor function was noted,   the above injectate was  administered so that equal amounts of the injectate were placed at each foramen (level) into the transforaminal epidural space.   Additional Comments:  The patient tolerated the procedure well Dressing: Band-Aid with 2 x 2 sterile gauze    Post-procedure details: Patient was observed during the  procedure. Post-procedure instructions were reviewed.  Patient left the clinic in stable condition.   Clinical History: No specialty comments available.     Objective:  VS:  HT:    WT:   BMI:     BP:   HR: bpm  TEMP: ( )  RESP:  Physical Exam Vitals and nursing note reviewed.  Constitutional:      General: He is not in acute distress.    Appearance: Normal appearance. He is obese. He is not ill-appearing.  HENT:     Head: Normocephalic and atraumatic.     Right Ear: External ear normal.     Left Ear: External ear normal.     Nose: No congestion.  Eyes:     Extraocular Movements: Extraocular movements intact.  Cardiovascular:     Rate and Rhythm: Normal rate.     Pulses: Normal pulses.  Pulmonary:     Effort: Pulmonary effort is normal. No respiratory distress.  Abdominal:     General: There is no distension.     Palpations: Abdomen is soft.  Musculoskeletal:        General: No tenderness or signs of injury.     Cervical back: Neck supple.     Right lower leg: No edema.     Left lower leg: No edema.     Comments: Patient has good distal strength without clonus.  Skin:    Findings: No erythema or rash.  Neurological:     General: No focal deficit present.     Mental Status: He is alert and oriented to person, place, and time.     Sensory: No sensory deficit.     Motor: No weakness or abnormal muscle tone.     Coordination: Coordination normal.  Psychiatric:        Mood and Affect: Mood normal.        Behavior: Behavior normal.      Imaging: No results found.

## 2022-09-26 NOTE — Procedures (Signed)
S1 Lumbosacral Transforaminal Epidural Steroid Injection - Sub-Pedicular Approach with Fluoroscopic Guidance   Patient: Anthony Collier      Date of Birth: 07/19/1975 MRN: [id number] PCP: Nelia Shi, MD      Visit Date: 09/24/2022   Universal Protocol:    Date/Time: 08/31/245:47 PM  Consent Given By: the patient  Position:  PRONE  Additional Comments: Vital signs were monitored before and after the procedure. Patient was prepped and draped in the usual sterile fashion. The correct patient, procedure, and site was verified.   Injection Procedure Details:  Procedure Site One Meds Administered:  Meds ordered this encounter  Medications   methylPREDNISolone acetate (DEPO-MEDROL) injection 80 mg    Laterality: Right  Location/Site:  S1 Foramen   Needle size: 22 ga.  Needle type: Spinal  Needle Placement: Transforaminal  Findings:   -Comments: Excellent flow of contrast along the nerve, nerve root and into the epidural space.  Epidurogram: Contrast epidurogram showed no nerve root cut off or restricted flow pattern.  Procedure Details: After squaring off the sacral end-plate to get a true AP view, the C-arm was positioned so that the best possible view of the S1 foramen was visualized. The soft tissues overlying this structure were infiltrated with 2-3 ml. of 1% Lidocaine without Epinephrine.    The spinal needle was inserted toward the target using a "trajectory" view along the fluoroscope beam.  Under AP and lateral visualization, the needle was advanced so it did not puncture dura. Biplanar projections were used to confirm position. Aspiration was confirmed to be negative for CSF and/or blood. A 1-2 ml. volume of Isovue-250 was injected and flow of contrast was noted at each level. Radiographs were obtained for documentation purposes.   After attaining the desired flow of contrast documented above, a 0.5 to 1.0 ml test dose of 0.25% Marcaine was injected into  each respective transforaminal space.  The patient was observed for 90 seconds post injection.  After no sensory deficits were reported, and normal lower extremity motor function was noted,   the above injectate was administered so that equal amounts of the injectate were placed at each foramen (level) into the transforaminal epidural space.   Additional Comments:  The patient tolerated the procedure well Dressing: Band-Aid with 2 x 2 sterile gauze    Post-procedure details: Patient was observed during the procedure. Post-procedure instructions were reviewed.  Patient left the clinic in stable condition.

## 2022-09-29 ENCOUNTER — Ambulatory Visit: Payer: BC Managed Care – PPO | Attending: Family Medicine | Admitting: Physical Therapy

## 2022-09-29 ENCOUNTER — Telehealth: Payer: Self-pay | Admitting: Physical Therapy

## 2022-09-29 NOTE — Telephone Encounter (Signed)
Attempted to contact patient due to missed PT appointment. Left voicemail informing patient of missed PT appointment and that this was his last scheduled appointment so will need to contact the clinic to schedule any further appointments. He was provided clinic contact information. Advised patient of attendance policy, this was his first missed PT appointment.   Rosana Hoes, PT, DPT, LAT, ATC 09/29/22  1:07 PM Phone: [phone] Fax: [phone]

## 2022-10-05 ENCOUNTER — Telehealth: Payer: Self-pay

## 2022-10-05 NOTE — Telephone Encounter (Signed)
Prior Authorization submitted for Trulicity 1.5.  Key: [id number]

## 2022-10-06 NOTE — Telephone Encounter (Signed)
Medication approved.   Pharmacy has been updated.  Patient aware.

## 2022-10-22 ENCOUNTER — Ambulatory Visit: Payer: BC Managed Care – PPO | Admitting: Orthopedic Surgery

## 2022-10-22 ENCOUNTER — Encounter: Payer: Self-pay | Admitting: Orthopedic Surgery

## 2022-10-22 DIAGNOSIS — M5126 Other intervertebral disc displacement, lumbar region: Secondary | ICD-10-CM

## 2022-10-22 NOTE — Progress Notes (Signed)
Orthopedic Spine Surgery Office Note   Assessment: Patient is a 47 y.o. male with low back pain that radiates into the right lower extremity in the S1 distribution.  Has a right paracentral disc herniation at L5/S1     Plan: -Patient has tried PT, Tylenol, ibuprofen, gabapentin, intramuscular steroid injection, tramadol, lumbar steroid injection -Reiterated the fact that I would want his BMI to 40 or less and an A1c of 7.5 or less prior to any surgical intervention to minimize his chance of complication -He has lost a little over 10 pounds so far. He knows his A1C recently increased and is working on that -Patient should return to office in 8 weeks, x-rays at next visit: none     Patient expressed understanding of the plan and all questions were answered to the patient's satisfaction.    ___________________________________________________________________________     History:   Patient is a 47 y.o. male who presents today for follow-up on his lumbar spine. He is still having low back pain that radiates into the right lower extremity. He feels it goes into the posterior aspect of his right thigh and leg. He got an injection after our last visit which helped but it only provided him with temporary relief. He does not have any pain in his left lower extremity. He gets numbness and paresthesias in the right leg along the posterior aspect of the thigh and leg. He is currently unable to work due to the pain.   Treatments tried: PT, Tylenol, ibuprofen, gabapentin, intramuscular steroid injection, tramadol, lumbar steroid injection     Physical Exam:   General: no acute distress, appears stated age Neurologic: alert, answering questions appropriately, following commands Respiratory: unlabored breathing on room air, symmetric chest rise Psychiatric: appropriate affect, normal cadence to speech     MSK (spine):   -Strength exam                                                   Left                   Right EHL                              5/5                  4+/5 TA                                 5/5                  5/5 GSC                             5/5                  4+/5 Knee extension            5/5                  5/5 Hip flexion                    5/5  5/5   -Sensory exam                           Sensation intact to light touch in L3-S1 nerve distributions of bilateral lower extremities (decreased in S1 distribution on the right)   -Achilles DTR: 2/4 on the left, 1/4 on the right -Patellar tendon DTR: 2/4 on the left, 2/4 on the right   -Straight leg raise: positive on the right, negative on the left -Femoral nerve stretch test: negative bilaterally  -Clonus: no beats bilaterally   Imaging: XRs of the lumbar spine from 08/12/2022 were previously independently reviewed and interpreted, showing no fracture or dislocation.  No significant degenerative changes.  No evidence of instability on flexion/extension views.   MRI of the lumbar spine from 07/23/2022 was previously independently reviewed and interpreted, showing right paracentral disc herniation at L5/S1 causing lateral recess and central stenosis.  Foraminal stenosis on the right at L5/S1.  No other significant stenosis seen.     Patient name: Anthony Collier Patient MRN: [id number] Date of visit: 10/22/22

## 2022-10-26 ENCOUNTER — Ambulatory Visit (INDEPENDENT_AMBULATORY_CARE_PROVIDER_SITE_OTHER): Payer: BC Managed Care – PPO | Admitting: Family Medicine

## 2022-10-26 ENCOUNTER — Encounter: Payer: Self-pay | Admitting: Family Medicine

## 2022-10-26 VITALS — BP 128/70 | HR 83 | Ht 74.0 in | Wt 382.0 lb

## 2022-10-26 DIAGNOSIS — Z1211 Encounter for screening for malignant neoplasm of colon: Secondary | ICD-10-CM

## 2022-10-26 DIAGNOSIS — Z794 Long term (current) use of insulin: Secondary | ICD-10-CM

## 2022-10-26 DIAGNOSIS — E119 Type 2 diabetes mellitus without complications: Secondary | ICD-10-CM | POA: Diagnosis not present

## 2022-10-26 DIAGNOSIS — I10 Essential (primary) hypertension: Secondary | ICD-10-CM

## 2022-10-26 DIAGNOSIS — M5430 Sciatica, unspecified side: Secondary | ICD-10-CM

## 2022-10-26 DIAGNOSIS — Z23 Encounter for immunization: Secondary | ICD-10-CM

## 2022-10-26 MED ORDER — DEXCOM G7 SENSOR MISC
1.0000 | 3 refills | Status: DC
Start: 2022-10-26 — End: 2022-11-26

## 2022-10-26 MED ORDER — DULAGLUTIDE 1.5 MG/0.5ML ~~LOC~~ SOAJ
1.5000 mg | SUBCUTANEOUS | 1 refills | Status: DC
Start: 2022-10-26 — End: 2022-11-26

## 2022-10-26 NOTE — Assessment & Plan Note (Addendum)
Too early to repeat A1c, patient return in 1 month for follow-up.  However, promising home CBGs on Trulicity.  Doing well on 1.5 mg weekly injection and will continue with this regimen.  Diabetic foot exam without concern for diabetic neuropathy. -Provided Dexcom CGM in clinic with refills, education provided per pharmacy -Refilled Trulicity, continue 1.5 mg dose weekly -Patient discontinued on Tresiba 8 units, appears reasonable given lower CBGs -Referral to [facility] for your eye exam

## 2022-10-26 NOTE — Patient Instructions (Addendum)
It was great to see you today! Thank you for choosing [facility] for your primary care.  Today we addressed:  Diabetes Great job taking your medicines as directed.  We are providing you with the Dexcom device today, please keep an eye on your glucose using this device.  I will need you to come back in 1 month to recheck your A1c so that we know where we stand. I referred you to an eye doctor within [facility].  Screening for colon cancer Schedule your colonoscopy to help detect colon cancer. The stomach doctors will call to schedule an appt with you. If you decide against this, please ask about the cologuard as an option.  If you had a referral placed, they will call you to set up an appointment. Please give [place] a call if you don't hear back in the next 2 weeks.  You should return to our clinic in 1 month.  Thank you for coming to see [place] at [facility] and for the opportunity to care for you! Spurgeon Gancarz, MD 10/26/2022, 2:17 PM

## 2022-10-26 NOTE — Assessment & Plan Note (Signed)
Stable, poorly controlled and affecting quality of life significantly.  Current goal with BMI less than 40 and A1c less than 7.5 orthopedic surgeon to perform necessary procedure.  Continue working on lifestyle factors and weight loss.  Unfortunately previously denied for Ozempic on insurance, continuing Trulicity is a reasonable alternative.

## 2022-10-26 NOTE — Progress Notes (Signed)
   SUBJECTIVE:   CHIEF COMPLAINT / HPI:  Anthony Collier is a 47 y.o. male with a pertinent past medical history of T2DM on Trulicity, OSA, HTN, sciatica presenting to the clinic for follow up on diabetes and sciatica.  Diabetes Last A1c 10.8 in July 2024 Home CBGs: Ranging 160s - 100 last few weeks.  Could not get CGM because did not follow up with Pharmacy. Medications: Dulaglutide 1.5 mg weekly, Metformin 1000 mg daily, stopped Evaristo Bury Eye exam: DUE, wants to switch to [facility] ophthalmologist Foot exam: DUE Microalbumin: UTD, normal Statin: Atorvastatin 10 mg Denies symptoms of acute hypoglycemia.  No GI distress on Trulicity, still able to eat well.  Has had some rare shaking of hands and some tiredness, but CBGs remained >100 Denies polyuria, polydipsia, numbness extremities, foot ulcers/trauma  Hypertension BP: 128/70 today. Home medications include: Losartan 25 mg daily. He endorses taking these medications as prescribed. Does not check blood pressure at home. Patient has had a BMP in the past 1 year. Denies hypotension symptoms including dizziness and orthostatic symptoms. Denies headache, vision changes, LUQ pain, hematuria.  Sciatica Follows closely with orthopedic surgery, working on weight loss and T2DM control. Surgery requires BMI to 40 or less and an A1c of 7.5 or less for interventions. Continues to experience pain and muscle weakness with foot drop.  Still stumbling. No change since last visit with ortho. Has tried PT, Tylenol, ibuprofen, gabapentin, intramuscular steroid injection, tramadol, lumbar steroid injection.  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   OBJECTIVE:   BP 128/70   Pulse 83   Ht 6\' 2"  (1.88 m)   Wt (!) 382 lb (173.3 kg)   SpO2 97%   BMI 49.05 kg/m   General: Age-appropriate,  resting comfortably in chair, NAD, alert and at baseline. Cardiovascular: Regular rate and rhythm. Normal S1/S2. No murmurs, rubs, or gallops appreciated. 2+ radial pulses. Pulmonary: Clear bilaterally to ascultation. No increased WOB, no accessory muscle usage on room air. No wheezes, crackles, or rhonchi. Extremities: No peripheral edema bilaterally. Capillary refill <2 seconds.  Diabetic foot exam with monofilament shows intact sensation and discrimination over all toes and plantar surface of feet.  No ulcers or calluses present.   ASSESSMENT/PLAN:   Diabetes mellitus (HCC) Too early to repeat A1c, patient return in 1 month for follow-up.  However, promising home CBGs on Trulicity.  Doing well on 1.5 mg weekly injection and will continue with this regimen.  Diabetic foot exam without concern for diabetic neuropathy. -Provided Dexcom CGM in clinic with refills, education provided per pharmacy -Refilled Trulicity, continue 1.5 mg dose weekly -Patient discontinued on Tresiba 8 units, appears reasonable given lower CBGs -Referral to [facility] for your eye exam  Essential hypertension Well-controlled. -Continue losartan 25 mg  Sciatica Stable, poorly controlled and affecting quality of life significantly.  Current goal with BMI less than 40 and A1c less than 7.5 orthopedic surgeon to perform necessary procedure.  Continue working on lifestyle factors and weight loss.  Unfortunately previously denied for Ozempic on insurance, continuing Trulicity is a reasonable alternative.  Health maintenance -Counseling and referral to GI for colonoscopy  Return in about 1 month (around 11/25/2022) for A1c check.  Kidada Ging Sharion Dove, MD [facility]

## 2022-10-26 NOTE — Assessment & Plan Note (Signed)
Well controlled   Continue losartan 25mg 

## 2022-10-27 ENCOUNTER — Telehealth: Payer: Self-pay | Admitting: Orthopedic Surgery

## 2022-10-27 NOTE — Telephone Encounter (Signed)
Pt called requesting to revise letter. Pt asking for a return call to discuss return to work date. Pt employer is asking. Please call pt at [phone].

## 2022-10-28 ENCOUNTER — Telehealth: Payer: Self-pay | Admitting: Orthopedic Surgery

## 2022-10-28 NOTE — Telephone Encounter (Signed)
Pt called requesting to revise letter. Pt asking for a return call to discuss return to work date. Pt employer is asking. Please call pt at [phone]    Please call this pt Dr Christell Constant Friday when you are in office. Please needs to have a conversation with Dr Christell Constant about a potential return to work date. Pt states his employer keeps requesting this note ASAP. Please call pt at [phone].

## 2022-10-29 ENCOUNTER — Other Ambulatory Visit (HOSPITAL_COMMUNITY): Payer: Self-pay

## 2022-10-29 NOTE — Telephone Encounter (Signed)
See my chart message

## 2022-10-30 ENCOUNTER — Encounter: Payer: Self-pay | Admitting: Radiology

## 2022-11-06 ENCOUNTER — Telehealth: Payer: Self-pay

## 2022-11-06 NOTE — Telephone Encounter (Signed)
Pharmacy Patient Advocate Encounter   Received notification from CoverMyMeds that prior authorization for [facility] G7 SENSORS is required/requested.   PA required; PA submitted to [facility] via CoverMyMeds Key/confirmation #/EOC [id number]. Status is pending

## 2022-11-09 NOTE — Telephone Encounter (Signed)
Pharmacy Patient Advocate Encounter  Received notification from [facility] that Prior Authorization for [facility] G7 SENSOR has been DENIED.  Full denial letter will be uploaded to the media tab. See denial reason below.    PA #/Case ID/Reference #: [id number]

## 2022-11-12 NOTE — Telephone Encounter (Signed)
Reviewed and agree with Dr Koval's plan.   

## 2022-11-12 NOTE — Telephone Encounter (Signed)
Patient contacted for potential enrollment in Liberate Study  Patient willing to come to visit to consider and potentially enroll.  Scheduled same day as next PCP visit 10/31  Total time with patient call and documentation of interaction: 8 minutes.  Libre 3 CGM potential.

## 2022-11-13 ENCOUNTER — Telehealth: Payer: Self-pay | Admitting: Family Medicine

## 2022-11-13 NOTE — Telephone Encounter (Signed)
Patient dropped off [employer] form to be completed. Last DOS was 10/26/22. Placed in [employer].

## 2022-11-13 NOTE — Telephone Encounter (Signed)
Reviewed form and placed in PCP's box for completion.  .Dontrelle Mazon R Damoni Erker, CMA  

## 2022-11-19 NOTE — Telephone Encounter (Signed)
Form faxed to [employer].   Copy left up front for patient as well.   Copy made for batch scanning.   Patient aware.

## 2022-11-23 ENCOUNTER — Encounter (INDEPENDENT_AMBULATORY_CARE_PROVIDER_SITE_OTHER): Payer: Self-pay

## 2022-11-23 ENCOUNTER — Encounter (INDEPENDENT_AMBULATORY_CARE_PROVIDER_SITE_OTHER): Payer: Self-pay | Admitting: Ophthalmology

## 2022-11-23 ENCOUNTER — Encounter (INDEPENDENT_AMBULATORY_CARE_PROVIDER_SITE_OTHER): Payer: BC Managed Care – PPO | Admitting: Ophthalmology

## 2022-11-26 ENCOUNTER — Ambulatory Visit: Payer: BC Managed Care – PPO | Admitting: Family Medicine

## 2022-11-26 ENCOUNTER — Encounter: Payer: Self-pay | Admitting: Pharmacist

## 2022-11-26 ENCOUNTER — Encounter: Payer: BC Managed Care – PPO | Admitting: Pharmacist

## 2022-11-26 ENCOUNTER — Encounter: Payer: Self-pay | Admitting: Family Medicine

## 2022-11-26 VITALS — BP 134/74 | HR 76 | Ht 74.0 in | Wt 383.4 lb

## 2022-11-26 VITALS — BP 134/74 | HR 76 | Wt 383.4 lb

## 2022-11-26 DIAGNOSIS — I10 Essential (primary) hypertension: Secondary | ICD-10-CM | POA: Diagnosis not present

## 2022-11-26 DIAGNOSIS — E119 Type 2 diabetes mellitus without complications: Secondary | ICD-10-CM

## 2022-11-26 DIAGNOSIS — Z23 Encounter for immunization: Secondary | ICD-10-CM

## 2022-11-26 DIAGNOSIS — M5431 Sciatica, right side: Secondary | ICD-10-CM

## 2022-11-26 DIAGNOSIS — Z794 Long term (current) use of insulin: Secondary | ICD-10-CM | POA: Diagnosis not present

## 2022-11-26 LAB — POCT GLYCOSYLATED HEMOGLOBIN (HGB A1C): HbA1c, POC (controlled diabetic range): 6.8 % (ref 0.0–7.0)

## 2022-11-26 MED ORDER — DULAGLUTIDE 3 MG/0.5ML ~~LOC~~ SOAJ
3.0000 mg | SUBCUTANEOUS | 1 refills | Status: DC
Start: 2022-11-26 — End: 2023-02-11

## 2022-11-26 NOTE — Assessment & Plan Note (Addendum)
Ortho follow up 11/25, will discuss lumbar surgery then and identify weight loss goals to qualify, now at A1c goal <7.  Possible spinal injection same day per Ortho.

## 2022-11-26 NOTE — Assessment & Plan Note (Signed)
LIBERATE Study:  -Patient provided verbal consent to participate in the study. However, POC A1c is 6.8% excluding patient from enrollment in study. Diabetes longstanding currently controlled. Patient is able to verbalize appropriate hypoglycemia management plan. Medication adherence appears good.  Weight has remained stable.  -Increased dulaglutide (Trulicity) to 3 mg once weekly after Patient finishes (#4) 1.5 mg pen (1) on hand -Patient educated on purpose, proper use, and potential adverse effects.  -Extensively discussed pathophysiology of diabetes, recommended lifestyle interventions, dietary effects on blood sugar control.  -Counseled on s/sx of and management of hypoglycemia.

## 2022-11-26 NOTE — Assessment & Plan Note (Addendum)
Greatly improved A1c from 10.8, still diabetic range, now at goal for back surgery.  Still struggling with weight loss. -Trulicity increased to 3 mg/week today by Dr. Raymondo Band, agree with this change -Follow up in 3 months for A1c check and weight check, sooner if having issues

## 2022-11-26 NOTE — Progress Notes (Signed)
SUBJECTIVE:   CHIEF COMPLAINT / HPI:  Anthony Collier is a 47 y.o. male with a pertinent past medical history of T2DM on Trulicity, OSA, HTN, sciatica presenting to the clinic for diabetes follow up.  Diabetes Last A1c 6.8, today Home CBGs: fasting 100-140, a few 200s after eating Medications: Trulicity 1.5 mg weekly, metformin 1000 mg daily Adherence: Good Eye exam: Appointment 11/15 Foot exam: UTD Microalbumin: UTD Statin: Atorvastatin 10 mg No symptoms of hypoglycemia, polyuria, polydipsia, numbness extremities, foot ulcers/trauma Now below A1c goal for spinal surgery, weight still above goal  Hypertension BP: 134/74 today. Home medications include: losartan 25 mg QHS. He endorses taking these medications as prescribed. Does not check blood pressure at home. Patient has had a BMP in the past 1 year. Denies hypotension symptoms including dizziness and orthostatic symptoms. Denies headache, vision changes, LUQ pain, hematuria.  R leg weakness, lumbar back injury, sciatica Has continued having weakness in R leg, some sciatic pain. 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  States he was told by mother that he was "born with polio." Has follow up with Ortho on 11/25 for further discussion of surgery now that A1c has decreased, may need to lose weight as well.  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   OBJECTIVE:   BP 134/74   Pulse 76   Ht 6\' 2"  (1.88 m)   Wt (!) 383 lb 6.4 oz (173.9 kg)   SpO2 96%   BMI 49.23 kg/m   General: Age-appropriate, resting comfortably in chair, large body habitus, NAD, alert and at baseline. Cardiovascular: Regular rate and rhythm. Normal S1/S2. No murmurs, rubs, or gallops appreciated. 2+ radial pulses. Pulmonary: Clear bilaterally to ascultation. No increased WOB, no  accessory muscle usage on room air. No wheezes, crackles, or rhonchi. Extremities: No peripheral edema bilaterally. Capillary refill <2 seconds.  Unilateral decreased calf muscle mass, R<L.  Results for orders placed or performed in visit on 11/26/22 (from the past 48 hour(s))  POCT glycosylated hemoglobin (Hb A1C)     Status: None   Collection Time: 11/26/22  1:23 PM  Result Value Ref Range   Hemoglobin A1C     HbA1c POC (<> result, manual entry)     HbA1c, POC (prediabetic range)     HbA1c, POC (controlled diabetic range) 6.8 0.0 - 7.0 %    ASSESSMENT/PLAN:   Assessment & Plan Type 2 diabetes mellitus without complication, with long-term current use of insulin (HCC) Greatly improved A1c from 10.8, still diabetic range, now at goal for back surgery.  Still struggling with weight loss. -Trulicity increased to 3 mg/week today by Dr. Raymondo Band, agree with this change -Follow up in 3 months for A1c check and weight check, sooner if having issues Sciatica of right side Ortho follow up 11/25, will discuss lumbar surgery then and identify weight loss goals to qualify, now at A1c goal <7.  Possible spinal injection same day per Ortho. Essential hypertension Above goal <130/80, but will continue current dose losartan 25 mg per patient request as he attempts weight loss with Trulicity. -Follow up at 3 month return visit and consider adding second agent  Health maintenance -Has not heard from [facility] about colonoscopy scheduling.  Referral previously placed.  Provided phone number patient can call to schedule. -Tdap vaccine administered today. -Ophtho exam 11/15.  Return in about 3 months (around 02/26/2023) for diabetes f/u.  Esmee Fallaw Sharion Dove, MD [facility]

## 2022-11-26 NOTE — Research (Signed)
S:     Chief Complaint  Patient presents with   Medication Management    LIBERATE - Enrollment Deferred   47 y.o. male who presents for diabetes evaluation, education, and management in the context of the LIBERATE Study. Patient has not made significant lifestyle changes for DM management, but is reporting Trulicity is helping with portion control.   PMH is significant for T2DM, HTN, HLD, Sciatica.   Patient was referred and last seen by Primary Care Provider, Dr. Sharion Dove, on 10/26/2022.   Today, patient arrives in good spirits and presents without any assistance.   Patient reports Diabetes was diagnosed in 2019  Family/Social History: A few cousins with T2DM  Current diabetes medications include: dulaglutide (Trulicity) 1.5 mg once weekly, metformin XR 500 mg 2 tablets once daily Current hypertension medications include: losartan 25 mg once daily Current hyperlipidemia medications include: atorvastatin 10 mg once daily  Patient reports adherence to taking all medications as prescribed.   Do you feel that your medications are working for you? yes Have you been experiencing any side effects to the medications prescribed? no Insurance coverage: BCBS  Patient reports hypoglycemic event - 3x weekly requiring soda/candy to feel better  Home glucose readings in ~100s.   @CGMFLO @  Patient reports nocturia (nighttime urination). Maybe once a night Patient reports neuropathy (nerve pain). Patient denies visual changes. Patient reports self foot exams.   Patient reported dietary habits: Eats ~2 meals/day - fried food, carbs, sporadic fruits/veggies - Trulicity is helping with appetite suppression Drinks: water, zero sugar soda  Patient-reported exercise habits: walks occasionally, stairs in his house - has sciatica which makes exercising painful  O:   Review of Systems  All other systems reviewed and are negative.   Physical Exam Vitals reviewed.  Constitutional:       Appearance: Normal appearance.  Pulmonary:     Effort: Pulmonary effort is normal.  Neurological:     Mental Status: He is alert.  Psychiatric:        Mood and Affect: Mood normal.        Behavior: Behavior normal.        Thought Content: Thought content normal.        Judgment: Judgment normal.    Lab Results  Component Value Date   HGBA1C 6.8 11/26/2022    POC A1c Today: 6.8  Vitals:   11/26/22 1323  BP: 134/74  Pulse: 76  SpO2: 96%     Lipid Panel     Component Value Date/Time   CHOL 134 05/13/2022 1156   TRIG 69 05/13/2022 1156   HDL 42 05/13/2022 1156   CHOLHDL 3.2 05/13/2022 1156   LDLCALC 78 05/13/2022 1156    Clinical Atherosclerotic Cardiovascular Disease (ASCVD): No  The 10-year ASCVD risk score (Arnett DK, et al., 2019) is: 12.9%   Values used to calculate the score:     Age: 70 years     Sex: Male     Is Non-Hispanic African American: Yes     Diabetic: Yes     Tobacco smoker: No     Systolic Blood Pressure: 134 mmHg     Is BP treated: Yes     HDL Cholesterol: 42 mg/dL     Total Cholesterol: 134 mg/dL   A/P:  LIBERATE Study:  -Patient provided verbal consent to participate in the study. However, POC A1c is 6.8% excluding patient from enrollment in study. Diabetes longstanding currently controlled. Patient is able to verbalize appropriate hypoglycemia management  plan. Medication adherence appears good.  Weight has remained stable.  -Increased dulaglutide (Trulicity) to 3 mg once weekly after Patient finishes (#4) 1.5 mg pen (1) on hand -Patient educated on purpose, proper use, and potential adverse effects.  -Extensively discussed pathophysiology of diabetes, recommended lifestyle interventions, dietary effects on blood sugar control.  -Counseled on s/sx of and management of hypoglycemia.   ASCVD risk - primary prevention in patient with diabetes. Last LDL is 78 not at goal of <70 mg/dL.  -Continued atorvastatin 10 mg once daily  Hypertension  longstanding currently controlled. Blood pressure goal of <130/80 mmHg. Medication adherence good.  -Continued losartan 25 mg once daily  Written patient instructions provided. Patient verbalized understanding of treatment plan.  Total time in face to face counseling 24 minutes.    Follow-up:  Pharmacist PRN per PCP request.  PCP clinic visit in PRN.  Patient seen with Caprice Beaver, PharmD Candidate and Shona Simpson, PharmD Candidate.

## 2022-11-26 NOTE — Progress Notes (Signed)
Reviewed and agree.

## 2022-11-26 NOTE — Patient Instructions (Signed)
It was nice to see you today!  Your goal blood sugar is 80-130 before eating and less than 180 after eating.  Medication Changes:  Increase Trulicity (dulaglutide) to 3 mg once weekly once you finish 1.5 mg on hand  Continue all other medication the same.   Monitor blood sugars at home and keep a log (glucometer or piece of paper) to bring with you to your next visit.  Aim for a diet full of vegetables, fruit and lean meats (chicken, [place], fish). Try to limit salt intake by eating fresh or frozen vegetables (instead of canned), rinse canned vegetables prior to cooking and do not add any additional salt to meals.

## 2022-11-26 NOTE — Patient Instructions (Addendum)
It was great to see you today! Thank you for choosing [facility] for your primary care.  Today we addressed: Diabetes Great job decreasing your A1c! It looks great. I hope your back surgery is possible soon and we will increase your Trulicity to 3 mg weekly to help with weight loss.  Colonoscopy Please call [facility] at the phone number below for your colonoscopy.  The referral is in, I am sorry you have not heard from them. [phone]  Thanks for getting the Tdap vaccine today.  You should return to our clinic in 3 months for an A1c check.  Thank you for coming to see [place] at [facility] and for the opportunity to care for you! Sharion Dove, [place]uz [place], MD 11/26/2022, 2:08 PM

## 2022-11-26 NOTE — Assessment & Plan Note (Addendum)
Above goal <130/80, but will continue current dose losartan 25 mg per patient request as he attempts weight loss with Trulicity. -Follow up at 3 month return visit and consider adding second agent

## 2022-12-11 ENCOUNTER — Other Ambulatory Visit: Payer: Self-pay | Admitting: Family Medicine

## 2022-12-11 DIAGNOSIS — E781 Pure hyperglyceridemia: Secondary | ICD-10-CM

## 2022-12-14 ENCOUNTER — Encounter (INDEPENDENT_AMBULATORY_CARE_PROVIDER_SITE_OTHER): Payer: BC Managed Care – PPO | Admitting: Ophthalmology

## 2022-12-14 DIAGNOSIS — H3581 Retinal edema: Secondary | ICD-10-CM

## 2022-12-17 ENCOUNTER — Encounter: Payer: Self-pay | Admitting: Gastroenterology

## 2022-12-21 ENCOUNTER — Ambulatory Visit (INDEPENDENT_AMBULATORY_CARE_PROVIDER_SITE_OTHER): Payer: BC Managed Care – PPO | Admitting: Orthopedic Surgery

## 2022-12-21 DIAGNOSIS — M5416 Radiculopathy, lumbar region: Secondary | ICD-10-CM | POA: Diagnosis not present

## 2022-12-21 NOTE — Progress Notes (Signed)
Orthopedic Spine Surgery Office Note   Assessment: Patient is a 47 y.o. male with low back pain that radiates into the right lower extremity in the S1 distribution.  Has a right paracentral disc herniation at L5/S1     Plan: -Patient has tried PT, Tylenol, ibuprofen, gabapentin, intramuscular steroid injection, tramadol, lumbar steroid injection -His A1c was 6.8 on 11/26/2022 so he just needs to lose weight to be a surgical candidate in my opinion. I still have a BMI cut off of 40 for him -I offered him a second opinion since he is still having significant pain in spite of trying conservative treatments, but he was not interested in that at this time -Patient should return to office in 8 weeks, x-rays at next visit: none     Patient expressed understanding of the plan and all questions were answered to the patient's satisfaction.    ___________________________________________________________________________     History:   Patient is a 47 y.o. male who presents today for follow-up on his lumbar spine.  He is still having low back pain that radiates into the right lower extremity.  He feels the pain radiating down the posterior aspect of the right thigh and leg.  He states that recently he has gotten used to the pain and it is not as bothersome.  However, he feels that the weather change has made his pain a little bit worse as of late.  He has not developed any new symptoms since he was last seen.  Still not having any left leg pain.  No bowel or bladder incontinence.  No saddle anesthesia.   Treatments tried: PT, Tylenol, ibuprofen, gabapentin, intramuscular steroid injection, tramadol, lumbar steroid injection     Physical Exam:   General: no acute distress, appears stated age Neurologic: alert, answering questions appropriately, following commands Respiratory: unlabored breathing on room air, symmetric chest rise Psychiatric: appropriate affect, normal cadence to speech     MSK  (spine):   -Strength exam                                                   Left                  Right EHL                              5/5                  4+/5 TA                                 5/5                  5/5 GSC                             5/5                  4+/5 Knee extension            5/5                  5/5 Hip flexion  5/5                  5/5   -Sensory exam                           Sensation intact to light touch in L3-S1 nerve distributions of bilateral lower extremities (decreased in S1 distribution on the right)   -Achilles DTR: 2/4 on the left, 1/4 on the right -Patellar tendon DTR: 2/4 on the left, 2/4 on the right   -Straight leg raise: positive on the right, negative on the left -Femoral nerve stretch test: negative bilaterally  -Clonus: no beats bilaterally   Imaging: XRs of the lumbar spine from 08/12/2022 were previously independently reviewed and interpreted, showing no fracture or dislocation.  No significant degenerative changes.  No evidence of instability on flexion/extension views.   MRI of the lumbar spine from 07/23/2022 was previously independently reviewed and interpreted, showing right paracentral disc herniation at L5/S1 causing lateral recess and central stenosis.  Foraminal stenosis on the right at L5/S1.  No other significant stenosis seen.     Patient name: Anthony Collier Patient MRN: [id number] Date of visit: 12/21/22

## 2022-12-22 ENCOUNTER — Encounter: Payer: Self-pay | Admitting: Orthopedic Surgery

## 2022-12-22 DIAGNOSIS — M5116 Intervertebral disc disorders with radiculopathy, lumbar region: Secondary | ICD-10-CM

## 2023-01-07 ENCOUNTER — Ambulatory Visit: Payer: BC Managed Care – PPO

## 2023-01-07 VITALS — Ht 74.0 in | Wt 387.0 lb

## 2023-01-07 DIAGNOSIS — Z1211 Encounter for screening for malignant neoplasm of colon: Secondary | ICD-10-CM

## 2023-01-07 MED ORDER — NA SULFATE-K SULFATE-MG SULF 17.5-3.13-1.6 GM/177ML PO SOLN
1.0000 | Freq: Once | ORAL | 0 refills | Status: AC
Start: 2023-01-07 — End: 2023-01-07

## 2023-01-07 NOTE — Progress Notes (Signed)
No egg or soy allergy known to patient  No issues known to pt with past sedation with any surgeries or procedures Patient denies ever being told they had issues or difficulty with intubation  No FH of Malignant Hyperthermia Pt is not on diet pills GLP1: Trulicity. 7 day hold last dose on or before 18/. Pt made aware failure to hold will result in cancellation of procedure  Pt is not on  home 02  OSA not using CPAP  Pt is not on blood thinners  Pt denies issues with constipation  No A fib or A flutter Have any cardiac testing pending--no  LOA: independent  Prep:  suprep   Patient's chart reviewed by Cathlyn Parsons CNRA prior to previsit and patient appropriate for the [facility].  Previsit completed and red dot placed by patient's name on their procedure day (on provider's schedule).     PV competed with patient. Prep instructions sent via mychart and home address. Goodrx coupon for [facility] provided to use for price reduction if needed.

## 2023-01-18 ENCOUNTER — Other Ambulatory Visit: Payer: Self-pay

## 2023-01-18 DIAGNOSIS — I10 Essential (primary) hypertension: Secondary | ICD-10-CM

## 2023-01-18 MED ORDER — LOSARTAN POTASSIUM 25 MG PO TABS
25.0000 mg | ORAL_TABLET | Freq: Every day | ORAL | 3 refills | Status: DC
Start: 2023-01-18 — End: 2023-07-19

## 2023-02-04 ENCOUNTER — Other Ambulatory Visit: Payer: Self-pay | Admitting: Neurosurgery

## 2023-02-09 ENCOUNTER — Encounter: Payer: Self-pay | Admitting: Gastroenterology

## 2023-02-10 ENCOUNTER — Other Ambulatory Visit: Payer: Self-pay | Admitting: Family Medicine

## 2023-02-10 DIAGNOSIS — E119 Type 2 diabetes mellitus without complications: Secondary | ICD-10-CM

## 2023-02-11 ENCOUNTER — Ambulatory Visit: Payer: BC Managed Care – PPO | Admitting: Orthopedic Surgery

## 2023-02-11 ENCOUNTER — Encounter: Payer: BC Managed Care – PPO | Admitting: Gastroenterology

## 2023-02-18 ENCOUNTER — Ambulatory Visit: Payer: 59 | Admitting: Family Medicine

## 2023-02-18 ENCOUNTER — Inpatient Hospital Stay (HOSPITAL_COMMUNITY): Admission: RE | Admit: 2023-02-18 | Payer: 59 | Source: Ambulatory Visit

## 2023-02-24 ENCOUNTER — Ambulatory Visit (HOSPITAL_COMMUNITY): Admission: RE | Admit: 2023-02-24 | Payer: Self-pay | Source: Home / Self Care | Admitting: Neurosurgery

## 2023-02-24 ENCOUNTER — Encounter (HOSPITAL_COMMUNITY): Admission: RE | Payer: Self-pay | Source: Home / Self Care

## 2023-02-24 SURGERY — LUMBAR LAMINECTOMY/DECOMPRESSION MICRODISCECTOMY 1 LEVEL
Anesthesia: General | Laterality: Right

## 2023-02-26 ENCOUNTER — Ambulatory Visit: Payer: Self-pay | Admitting: Family Medicine

## 2023-03-01 ENCOUNTER — Encounter: Payer: Self-pay | Admitting: Family Medicine

## 2023-03-01 ENCOUNTER — Ambulatory Visit (INDEPENDENT_AMBULATORY_CARE_PROVIDER_SITE_OTHER): Payer: 59 | Admitting: Family Medicine

## 2023-03-01 VITALS — BP 138/80 | HR 83 | Ht 74.0 in | Wt 388.0 lb

## 2023-03-01 DIAGNOSIS — E119 Type 2 diabetes mellitus without complications: Secondary | ICD-10-CM

## 2023-03-01 DIAGNOSIS — M5431 Sciatica, right side: Secondary | ICD-10-CM | POA: Diagnosis not present

## 2023-03-01 DIAGNOSIS — Z23 Encounter for immunization: Secondary | ICD-10-CM

## 2023-03-01 DIAGNOSIS — Z794 Long term (current) use of insulin: Secondary | ICD-10-CM

## 2023-03-01 DIAGNOSIS — I10 Essential (primary) hypertension: Secondary | ICD-10-CM

## 2023-03-01 LAB — POCT GLYCOSYLATED HEMOGLOBIN (HGB A1C): HbA1c, POC (controlled diabetic range): 6.8 % (ref 0.0–7.0)

## 2023-03-01 NOTE — Patient Instructions (Addendum)
It was great to see you today! Thank you for choosing [facility] for your primary care.  Today we addressed: Diabetes Your A1c is stable at 6.8, great work!  Keep up your regimen for now.  Hypertension Your blood pressure looks solid, keep moving as much as you can, though I know it is hard with your sciatic pain.  Thank you for getting the PCV vaccine today, it will help prevent pneumonia for you and loved ones.  You can come back in 6 months.  I hope your insurance will be back to normal and your surgery will be done by then, with you feeling better.  We will talk about a colonoscopy, vision screening, and other things then.  Thank you for coming to see [place] at [facility] and for the opportunity to care for you! Sharion Dove, Delmont Prosch, MD 03/01/2023, 1:54 PM

## 2023-03-01 NOTE — Assessment & Plan Note (Signed)
 PCV-20 administered.

## 2023-03-01 NOTE — Assessment & Plan Note (Signed)
At goal, continue current regimen. - Continue losartan 25 mg daily

## 2023-03-01 NOTE — Assessment & Plan Note (Signed)
From PCP perspective, surgical intervention is appropriate at this point if indicated per Neurosurgery.  Patient's mobility and ability to work are compromised and he experiences significant debilitating pain.  Happy to provide further documentation to that effect if needed.

## 2023-03-01 NOTE — Assessment & Plan Note (Signed)
A1c 6.8 today, in diabetic range, stable from last visit.  At goal for sciatic nerve surgery.  Working on weight loss, but difficult due to inability to do physical activity.  Will discuss further lifestyle interventions and preventative screenings next visit, ideally after patient is able to get his sciatic nerve surgery. - Continue Trulicity 1.5 mg weekly, metformin 1000 mg daily - Follow up in 6 months

## 2023-03-01 NOTE — Progress Notes (Signed)
SUBJECTIVE:   CHIEF COMPLAINT / HPI:  Anthony Collier is a 48 y.o. male with a pertinent past medical history of T2DM on Trulicity, OSA, HTN, sciatic nerve impingement presenting to the clinic for chronic condition follow-up.  R sciatic nerve impingement The patient has been awaiting surgery for this condition and it was scheduled for 02/24/2023.  Unfortunately, the surgery was cancelled due to insurance issues, he was denied until he does 6 weeks of PT.  Already plugged in to see PT and does not need a referral. 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 Having numbness of R foot if he walks too much. Significant pain shooting down leg with activity, bending down, sitting the wrong way. Foot is not dropping or dragging.  Diabetes Last A1c 6.8 today, unchanged from October 2024, still at goal for surgery Home CBGs: 110-120s Medications: Trulicity 1.5 mg weekly, metformin 1000 mg daily Adherence: Great Eye exam: Vision insurance cancelled due to being out of work Foot exam: UTD Microalbumin: UTD Statin: Atorvastatin 10 mg  symptoms of hypoglycemia, polyuria, polydipsia, numbness extremities, foot ulcers/trauma  Hypertension BP: 138/80 today. Home medications include: Losartan 25 mg daily. He endorses taking these medications as prescribed. Diet: Reduced salt. Exercise: Limited by pain at the moment. Patient has had a BMP in the past 1 year. Denies hypotension symptoms including dizziness and orthostatic symptoms. Denies headache, vision changes, LUQ pain, hematuria.   PERTINENT PMH / PSH: PMH of T2DM on Trulicity, OSA, HTN, sciatica. 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  Now trying to pivot to different department that will allow him to work more. Of note, patient was  required to pay $120 copay for colonoscopy upfront, but was unable to do so due to being out of work.  Also unable to go to Ophthalmology visit due to lack of vision coverage.   OBJECTIVE:   BP 138/80   Pulse 83   Ht 6\' 2"  (1.88 m)   Wt (!) 388 lb (176 kg)   SpO2 94%   BMI 49.82 kg/m   General: Age-appropriate, resting comfortably in chair, NAD, alert and at baseline. Cardiovascular: Regular rate and rhythm. Normal S1/S2. No murmurs, rubs, or gallops appreciated. 2+ radial pulses. Pulmonary: Clear bilaterally to ascultation. No increased WOB, no accessory muscle usage on room air. No wheezes, crackles, or rhonchi. Abdominal: No tenderness to deep or light palpation. No rebound or guarding. No HSM. Skin: Warm and dry. Extremities: No peripheral edema bilaterally. Capillary refill <2 seconds.  Results for orders placed or performed in visit on 03/01/23 (from the past 48 hours)  POCT glycosylated hemoglobin (Hb A1C)     Status: None   Collection Time: 03/01/23  1:39 PM  Result Value Ref Range   Hemoglobin A1C     HbA1c POC (<> result, manual entry)     HbA1c, POC (prediabetic range)     HbA1c, POC (controlled diabetic range) 6.8 0.0 - 7.0 %    ASSESSMENT/PLAN:   Assessment & Plan Type 2 diabetes mellitus without complication, with long-term current use of insulin (HCC) A1c 6.8 today, in diabetic range, stable from last visit.  At goal for sciatic nerve surgery.  Working on weight loss, but difficult due to inability to do physical activity.  Will discuss further lifestyle  interventions and preventative screenings next visit, ideally after patient is able to get his sciatic nerve surgery. - Continue Trulicity 1.5 mg weekly, metformin 1000 mg daily - Follow up in 6 months Essential hypertension At goal, continue current regimen. - Continue losartan 25 mg daily Sciatica of right side From PCP perspective, surgical intervention is appropriate at this point if indicated per  Neurosurgery.  Patient's mobility and ability to work are compromised and he experiences significant debilitating pain.  Happy to provide further documentation to that effect if needed. Encounter for immunization - PCV 20 administered  Return in about 6 months (around 08/29/2023) for T2DM, HTN.  Tonie Vizcarrondo Sharion Dove, MD [facility]

## 2023-03-02 ENCOUNTER — Ambulatory Visit: Payer: 59 | Attending: Neurosurgery

## 2023-03-02 DIAGNOSIS — M6281 Muscle weakness (generalized): Secondary | ICD-10-CM | POA: Insufficient documentation

## 2023-03-02 DIAGNOSIS — R293 Abnormal posture: Secondary | ICD-10-CM | POA: Insufficient documentation

## 2023-03-02 DIAGNOSIS — M79604 Pain in right leg: Secondary | ICD-10-CM | POA: Diagnosis present

## 2023-03-02 NOTE — Therapy (Signed)
 OUTPATIENT PHYSICAL THERAPY THORACOLUMBAR EVALUATION   Patient Name: Anthony Collier MRN: [id number] DOB:05/09/1975, 48 y.o., male Today's Date: 03/02/2023  END OF SESSION:  PT End of Session - 03/02/23 1423     Visit Number 1    Number of Visits 7    Date for PT Re-Evaluation 04/16/23    Authorization Type aetna state    PT Start Time 1445    PT Stop Time 1510    PT Time Calculation (min) 25 min    Activity Tolerance Patient tolerated treatment well    Behavior During Therapy WFL for tasks assessed/performed             Past Medical History:  Diagnosis Date   Diabetes mellitus without complication (HCC)    Hypertension    No past surgical history on file. Patient Active Problem List   Diagnosis Date Noted   Encounter for immunization 03/01/2023   Sciatica 07/17/2022   Left knee pain 06/30/2022   Vertigo 11/14/2021   Mid back pain on right side 08/29/2021   Severe obstructive sleep apnea 05/07/2021   Essential hypertension 04/11/2021   Snoring 03/27/2021   Balanitis 03/07/2021   Hypertriglyceridemia 04/04/2020   Numbness of left lower extremity 09/23/2018   Obesity, morbid (HCC) 09/17/2017   Diabetes mellitus (HCC) 03/31/2017    PCP: Claudetta Silence, MD  REFERRING PROVIDER: Reyes Budge, MD  REFERRING DIAG: M51.27 (ICD-10-CM) - Herniated nucleus pulposus of lumbosacral region  Rationale for Evaluation and Treatment: Rehabilitation  THERAPY DIAG:  Abnormal posture - Plan: PT plan of care cert/re-cert  Muscle weakness (generalized) - Plan: PT plan of care cert/re-cert  ONSET DATE: 02/22/2023 referral  SUBJECTIVE:                                                                                                                                                                                           SUBJECTIVE STATEMENT: Patient arrives to clinic with wife. Back in June 2024 he started having back pain- job requires a lot of physical activity. It has  been found that he has a herniated nucleus pulposus. Trialed PT, that didn't help. Surgery was cancelled because he didn't complete 6 weeks of PT and so is here now. 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 Unaffected by current back pain.   PERTINENT HISTORY:  HTN, DMII  PAIN:  Are you having pain? Yes: NPRS scale: 0/10 now, 10/10 at worst Pain location: low back and down R leg Pain description: nagging Aggravating factors: walking a lot (~30 mins+) Relieving factors: resting, pain rx as needed  PRECAUTIONS: None  RED FLAGS: Bowel or bladder incontinence: No   WEIGHT BEARING RESTRICTIONS: No  FALLS:  Has patient fallen in last 6 months? No  LIVING ENVIRONMENT: Lives with: lives with their family Lives in: House/apartment Stairs: Yes: Internal: 2 flights steps; on right going up and External: 8-9 steps; on right going up Has following equipment at home: None  OCCUPATION: landscaper  PLOF: Independent currently on leave, (+) driving  PATIENT GOALS: this is mandatory  OBJECTIVE:  Note: Objective measures were completed at Evaluation unless otherwise noted.  DIAGNOSTIC FINDINGS:  IMPRESSION: 1. At L5-S1 there is a broad-based disc bulge with a large right subarticular disc extrusion and mass effect on the right intraspinal S1 nerve root. Narrowing of the left lateral recess. Moderate spinal stenosis. Moderate bilateral facet arthropathy. Severe right foraminal stenosis. Moderate-severe left foraminal stenosis. 2. At L4-5 there is a mild broad-based disc bulge. Moderate bilateral facet arthropathy. Mild bilateral foraminal stenosis. 3. At L3-4 there is mild bilateral facet arthropathy. Mild bilateral foraminal stenosis, right greater than left. 4.  No acute osseous injury of the lumbar spine.  PATIENT SURVEYS:  Modified Oswestry 54%   COGNITION: Overall cognitive status: Within functional limits for tasks  assessed     SENSATION: Light touch: Impaired  distal R LE  POSTURE: rounded shoulders, decreased lumbar lordosis, increased thoracic kyphosis, posterior pelvic tilt, and flexed trunk    LUMBAR ROM:   AROM eval  Flexion 50%  Extension 25%  Right lateral flexion 75%  Left lateral flexion 50%  Right rotation 75%  Left rotation 75%   (Blank rows = not tested)  LOWER EXTREMITY MMT:    MMT Right eval Left eval  Hip flexion 4 5  Hip extension    Hip abduction 4 5  Hip adduction 4 5  Hip internal rotation    Hip external rotation    Knee flexion 4 5  Knee extension 4 5  Ankle dorsiflexion 3 5  Ankle plantarflexion    Ankle inversion    Ankle eversion     (Blank rows = not tested)  LUMBAR SPECIAL TESTS:  Slump test: Positive  GAIT: Distance walked: clinic Assistive device utilized: None Level of assistance: Complete Independence Comments: WBOS, trendelenburg  TREATMENT N/a eval                                                                                                                           PATIENT EDUCATION:  Education details: PT POC, exam findings, rehab prognosis Person educated: Patient and Spouse Education method: Explanation Education comprehension: verbalized understanding  HOME EXERCISE PROGRAM: To be provided  ASSESSMENT:  CLINICAL IMPRESSION: Patient is a 48 y.o. male who was seen today for physical therapy evaluation and treatment for low back pain. He previously had a few visits of PT and this did not help him. He was scheduled to have surgery on his back, but per patient, surgery was cancelled as he had not completed enough  PT- and so is back. His oswestry score does reflect 54% disability related to his back pain. He is unable to work due to his back pain. He would benefit from a trial of PT to address the above mentioned deficits.   OBJECTIVE IMPAIRMENTS: decreased knowledge of condition, decreased mobility, decreased strength, increased  fascial restrictions, improper body mechanics, postural dysfunction, obesity, and pain.   ACTIVITY LIMITATIONS: carrying, lifting, bending, sitting, standing, squatting, stairs, locomotion level, and caring for others  PARTICIPATION LIMITATIONS: community activity, occupation, and yard work  PERSONAL FACTORS: Behavior pattern, Fitness, Past/current experiences, Profession, Time since onset of injury/illness/exacerbation, and 1-2 comorbidities: see above  are also affecting patient's functional outcome.   REHAB POTENTIAL: Fair chronicity  CLINICAL DECISION MAKING: Stable/uncomplicated  EVALUATION COMPLEXITY: Low   GOALS: Goals reviewed with patient? Yes  SHORT TERM GOALS: Target date: 03/26/23  Pt will be independent with initial HEP for improved symptom report  Baseline: to be provided Goal status: INITIAL   LONG TERM GOALS: Target date: 04/16/23  Pt will be independent with final HEP for improved symptom report  Baseline: to be provided Goal status: INITIAL  2.  Patient will improve Oswestry score to </= 45% to demonstrate less disability related to his back pain. Baseline: 54% Goal status: INITIAL   PLAN:  PT FREQUENCY: 1x/week  PT DURATION: 6 weeks  PLANNED INTERVENTIONS: 97164- PT Re-evaluation, 97110-Therapeutic exercises, 97530- Therapeutic activity, 97112- Neuromuscular re-education, 97535- Self Care, [id number]- Manual therapy, [phone]- Gait training, [phone]- Orthotic Fit/training, [phone]- Aquatic Therapy, [phone]- Electrical stimulation (manual), Patient/Family education, Balance training, Stair training, Taping, Dry Needling, Joint mobilization, Spinal mobilization, Vestibular training, Visual/preceptual remediation/compensation, Cognitive remediation, DME instructions, Cryotherapy, and Moist heat.  PLAN FOR NEXT SESSION: HEP, R LE strengthening, sciatic nerve glides, did not like DN previously   Delon DELENA Pop, PT Delon DELENA Pop, PT, DPT, CBIS  03/02/2023, 3:37 PM

## 2023-03-09 ENCOUNTER — Telehealth: Payer: Self-pay

## 2023-03-09 ENCOUNTER — Other Ambulatory Visit: Payer: Self-pay | Admitting: Family Medicine

## 2023-03-09 DIAGNOSIS — M5431 Sciatica, right side: Secondary | ICD-10-CM

## 2023-03-09 DIAGNOSIS — M62838 Other muscle spasm: Secondary | ICD-10-CM

## 2023-03-09 MED ORDER — CYCLOBENZAPRINE HCL 10 MG PO TABS: 10.0000 mg | ORAL_TABLET | Freq: Three times a day (TID) | ORAL | 0 refills | Status: DC | PRN

## 2023-03-09 NOTE — Telephone Encounter (Signed)
Patient calls nurse line requesting refill on cyclobenzaprine.   This is not on medication list.   Will forward to PCP for further advisement.   Veronda Prude, RN

## 2023-03-09 NOTE — Telephone Encounter (Signed)
Called patient, verified DOB.  Discussed cyclobenzaprine, which was removed from med list by pharmacy tech on 1/20[facility]  Patient notes he had been taking for several months for muscle spasms 2/2 sciatica and back injury, then stopped for a while, but had a few pills left over.  Recently started having more muscle spasms and took leftover pills with some relief.  Now requesting refill.  Does hope to have lumbar disc surgery with neurosurgery, was denied by insurance on 1/29 but planning to reattempt auth in 6 weeks.  Will provide cyclobenzaprine 10 mg TID x1 month with option to call for refill until surgery.  Discussed CNS sedation side effects and precautions including driving and operating heavy machinery on medication.  Patient is aware and understands that chronic use of muscle relaxer is not recommended.

## 2023-03-10 ENCOUNTER — Ambulatory Visit: Payer: 59 | Admitting: Physical Therapy

## 2023-03-10 DIAGNOSIS — M6281 Muscle weakness (generalized): Secondary | ICD-10-CM

## 2023-03-10 DIAGNOSIS — R293 Abnormal posture: Secondary | ICD-10-CM

## 2023-03-10 DIAGNOSIS — M79604 Pain in right leg: Secondary | ICD-10-CM

## 2023-03-10 NOTE — Therapy (Signed)
OUTPATIENT PHYSICAL THERAPY THORACOLUMBAR TREATMENT   Patient Name: Anthony Collier MRN: [id number] DOB:11/29/1975, 48 y.o., male Today's Date: 03/10/2023  END OF SESSION:  PT End of Session - 03/10/23 1314     Visit Number 2    Number of Visits 7    Date for PT Re-Evaluation 04/16/23    Authorization Type aetna state    PT Start Time 1313    PT Stop Time 1354    PT Time Calculation (min) 41 min    Activity Tolerance Patient tolerated treatment well    Behavior During Therapy WFL for tasks assessed/performed              Past Medical History:  Diagnosis Date   Diabetes mellitus without complication (HCC)    Hypertension    No past surgical history on file. Patient Active Problem List   Diagnosis Date Noted   Encounter for immunization 03/01/2023   Sciatica 07/17/2022   Left knee pain 06/30/2022   Vertigo 11/14/2021   Mid back pain on right side 08/29/2021   Severe obstructive sleep apnea 05/07/2021   Essential hypertension 04/11/2021   Snoring 03/27/2021   Balanitis 03/07/2021   Hypertriglyceridemia 04/04/2020   Numbness of left lower extremity 09/23/2018   Obesity, morbid (HCC) 09/17/2017   Diabetes mellitus (HCC) 03/31/2017    PCP: Nelia Shi, MD  REFERRING PROVIDER: Tressie Stalker, MD  REFERRING DIAG: M51.27 (ICD-10-CM) - Herniated nucleus pulposus of lumbosacral region  Rationale for Evaluation and Treatment: Rehabilitation  THERAPY DIAG:  Abnormal posture  Muscle weakness (generalized)  Pain in right leg  ONSET DATE: 02/22/2023 referral  SUBJECTIVE:                                                                                                                                                                                           SUBJECTIVE STATEMENT: Pt reports his pain is 4/10 pain in his low back, through his R buttocks, and down his R leg into his foot. Pt reports his pain is worse with cold weather. Pt denies any falls or  acute changes since initial eval.  Accompanied by: wife  PERTINENT HISTORY:  HTN, DMII  PAIN:  Are you having pain? Yes: NPRS scale: 4/10 Pain location: low back and down R leg Pain description: nagging Aggravating factors: walking a lot (~30 mins+) Relieving factors: resting, pain rx as needed  PRECAUTIONS: None  RED FLAGS: Bowel or bladder incontinence: No   WEIGHT BEARING RESTRICTIONS: No  FALLS:  Has patient fallen in last 6 months? No  LIVING ENVIRONMENT: Lives with: lives with their family Lives in: House/apartment Stairs: Yes: Internal: 2  flights steps; on right going up and External: 8-9 steps; on right going up Has following equipment at home: None  OCCUPATION: landscaper  PLOF: Independent currently on leave, (+) driving  PATIENT GOALS: "this is mandatory"  OBJECTIVE:  Note: Objective measures were completed at Evaluation unless otherwise noted.  DIAGNOSTIC FINDINGS:  IMPRESSION: 1. At L5-S1 there is a broad-based disc bulge with a large right subarticular disc extrusion and mass effect on the right intraspinal S1 nerve root. Narrowing of the left lateral recess. Moderate spinal stenosis. Moderate bilateral facet arthropathy. Severe right foraminal stenosis. Moderate-severe left foraminal stenosis. 2. At L4-5 there is a mild broad-based disc bulge. Moderate bilateral facet arthropathy. Mild bilateral foraminal stenosis. 3. At L3-4 there is mild bilateral facet arthropathy. Mild bilateral foraminal stenosis, right greater than left. 4.  No acute osseous injury of the lumbar spine.  PATIENT SURVEYS:  Modified Oswestry 54%   COGNITION: Overall cognitive status: Within functional limits for tasks assessed     SENSATION: Light touch: Impaired  distal R LE  POSTURE: rounded shoulders, decreased lumbar lordosis, increased thoracic kyphosis, posterior pelvic tilt, and flexed trunk    LUMBAR ROM:   AROM eval  Flexion 50%  Extension 25%  Right  lateral flexion 75%  Left lateral flexion 50%  Right rotation 75%  Left rotation 75%   (Blank rows = not tested)  LOWER EXTREMITY MMT:    MMT Right eval Left eval  Hip flexion 4 5  Hip extension    Hip abduction 4 5  Hip adduction 4 5  Hip internal rotation    Hip external rotation    Knee flexion 4 5  Knee extension 4 5  Ankle dorsiflexion 3 5  Ankle plantarflexion    Ankle inversion    Ankle eversion     (Blank rows = not tested)  LUMBAR SPECIAL TESTS:  Slump test: Positive  GAIT: Distance walked: clinic Assistive device utilized: None Level of assistance: Complete Independence Comments: WBOS, trendelenburg  TREATMENT TherEx Trial of various exercises to work on low back and LE stretching and strengthening: Supine RLE sciatic nerve glides x 10 reps "Aggravating" Supine figure 4 piriformis stretch 3 x 30 sec on R side Some pain in R hip Supine SKTC 3 x 30 sec each B Supine LTR x 10 reps B with 5 sec hold Feels more of a pull on R side Supine PPT x 10 reps with 5 sec hold + marches x 10 reps B + bridges x 10 reps with 5 sec hold Seated anterior leans on blue Swiss ball x 10 reps with 5 sec hold +lateral lean to the L to increase stretch on R side x 5 reps Seated figure 4 piriformis stretch 3 x 30 sec on R side Some pain in R hip  Added appropriate exercises to HEP, see bolded below  PATIENT EDUCATION:  Education details: initiated HEP Person educated: Patient and Spouse Education method: Explanation, Facilities manager, Actor cues, Verbal cues, and Handouts Education comprehension: verbalized understanding, returned demonstration, and needs further education  HOME EXERCISE PROGRAM: Access Code: [email or web address] URL: [email or web address] Date: 03/10/2023 Prepared by: Peter Congo  Exercises - Supine Sciatic Nerve Glide  - 1  x daily - 7 x weekly - 1-2 sets - 10 reps - Supine Lower Trunk Rotation  - 1 x daily - 7 x weekly - 3 sets - 10 reps - Supine Posterior Pelvic Tilt  - 1 x daily - 7 x weekly - 1 sets - 10 reps - 5 sec hold - Supine March with Posterior Pelvic Tilt  - 1 x daily - 7 x weekly - 3 sets - 10 reps - Supine Bridge  - 1 x daily - 7 x weekly - 3 sets - 10 reps   ASSESSMENT:  CLINICAL IMPRESSION: Emphasis of skilled PT session on trialing various exercises in supine and seated position to address muscle tightness, pain, and weakness. Created an initial HEP for patient based on performance this session. Pt continues to benefit from skilled PT services to work towards increased independence with management of his pain symptoms. Continue POC.   OBJECTIVE IMPAIRMENTS: decreased knowledge of condition, decreased mobility, decreased strength, increased fascial restrictions, improper body mechanics, postural dysfunction, obesity, and pain.   ACTIVITY LIMITATIONS: carrying, lifting, bending, sitting, standing, squatting, stairs, locomotion level, and caring for others  PARTICIPATION LIMITATIONS: community activity, occupation, and yard work  PERSONAL FACTORS: Behavior pattern, Fitness, Past/current experiences, Profession, Time since onset of injury/illness/exacerbation, and 1-2 comorbidities: see above  are also affecting patient's functional outcome.   REHAB POTENTIAL: Fair chronicity  CLINICAL DECISION MAKING: Stable/uncomplicated  EVALUATION COMPLEXITY: Low   GOALS: Goals reviewed with patient? Yes  SHORT TERM GOALS: Target date: 03/26/23  Pt will be independent with initial HEP for improved symptom report  Baseline: to be provided Goal status: INITIAL   LONG TERM GOALS: Target date: 04/16/23  Pt will be independent with final HEP for improved symptom report  Baseline: to be provided Goal status: INITIAL  2.  Patient will improve Oswestry score to </= 45% to demonstrate less disability  related to his back pain. Baseline: 54% Goal status: INITIAL   PLAN:  PT FREQUENCY: 1x/week  PT DURATION: 6 weeks  PLANNED INTERVENTIONS: 97164- PT Re-evaluation, 97110-Therapeutic exercises, 97530- Therapeutic activity, 97112- Neuromuscular re-education, 97535- Self Care, 01027- Manual therapy, [phone]- Gait training, [phone]- Orthotic Fit/training, [phone]- Aquatic Therapy, [phone]- Electrical stimulation (manual), Patient/Family education, Balance training, Stair training, Taping, Dry Needling, Joint mobilization, Spinal mobilization, Vestibular training, Visual/preceptual remediation/compensation, Cognitive remediation, DME instructions, Cryotherapy, and Moist heat.  PLAN FOR NEXT SESSION: add to HEP PRN, R LE strengthening, sciatic nerve glides, did not like DN previously   Peter Congo, PT Peter Congo, PT, DPT, CSRS   03/10/2023, 1:54 PM

## 2023-03-17 ENCOUNTER — Ambulatory Visit: Payer: 59 | Admitting: Physical Therapy

## 2023-03-24 ENCOUNTER — Ambulatory Visit: Payer: 59

## 2023-03-24 DIAGNOSIS — R293 Abnormal posture: Secondary | ICD-10-CM

## 2023-03-24 DIAGNOSIS — M79604 Pain in right leg: Secondary | ICD-10-CM

## 2023-03-24 DIAGNOSIS — M6281 Muscle weakness (generalized): Secondary | ICD-10-CM

## 2023-03-24 NOTE — Therapy (Signed)
 OUTPATIENT PHYSICAL THERAPY THORACOLUMBAR TREATMENT   Patient Name: Anthony Collier MRN: [id number] DOB:Mar 07, 1975, 48 y.o., male Today's Date: 03/24/2023  END OF SESSION:  PT End of Session - 03/24/23 1251     Visit Number 3    Number of Visits 7    Date for PT Re-Evaluation 04/16/23    Authorization Type aetna state    PT Start Time 1313    PT Stop Time 1351    PT Time Calculation (min) 38 min    Activity Tolerance Patient tolerated treatment well    Behavior During Therapy WFL for tasks assessed/performed              Past Medical History:  Diagnosis Date   Diabetes mellitus without complication (HCC)    Hypertension    History reviewed. No pertinent surgical history. Patient Active Problem List   Diagnosis Date Noted   Encounter for immunization 03/01/2023   Sciatica 07/17/2022   Left knee pain 06/30/2022   Vertigo 11/14/2021   Mid back pain on right side 08/29/2021   Severe obstructive sleep apnea 05/07/2021   Essential hypertension 04/11/2021   Snoring 03/27/2021   Balanitis 03/07/2021   Hypertriglyceridemia 04/04/2020   Numbness of left lower extremity 09/23/2018   Obesity, morbid (HCC) 09/17/2017   Diabetes mellitus (HCC) 03/31/2017    PCP: Nelia Shi, MD  REFERRING PROVIDER: Tressie Stalker, MD  REFERRING DIAG: M51.27 (ICD-10-CM) - Herniated nucleus pulposus of lumbosacral region  Rationale for Evaluation and Treatment: Rehabilitation  THERAPY DIAG:  Abnormal posture  Muscle weakness (generalized)  Pain in right leg  ONSET DATE: 02/22/2023 referral  SUBJECTIVE:                                                                                                                                                                                           SUBJECTIVE STATEMENT: "I'm just here, this is a waste of time." Per patient, exercises provided only irritate his pain. Denies falls.   Accompanied by: wife  PERTINENT HISTORY:  HTN,  DMII  PAIN:  Are you having pain? Yes: NPRS scale: 5/10 Pain location: low back and down R leg Pain description: nagging Aggravating factors: walking a lot (~30 mins+) Relieving factors: resting, pain rx as needed  PRECAUTIONS: None   PATIENT GOALS: "this is mandatory"  OBJECTIVE:  Note: Objective measures were completed at Evaluation unless otherwise noted.  DIAGNOSTIC FINDINGS:  IMPRESSION: 1. At L5-S1 there is a broad-based disc bulge with a large right subarticular disc extrusion and mass effect on the right intraspinal S1 nerve root. Narrowing of the left lateral recess. Moderate spinal stenosis. Moderate bilateral facet  arthropathy. Severe right foraminal stenosis. Moderate-severe left foraminal stenosis. 2. At L4-5 there is a mild broad-based disc bulge. Moderate bilateral facet arthropathy. Mild bilateral foraminal stenosis. 3. At L3-4 there is mild bilateral facet arthropathy. Mild bilateral foraminal stenosis, right greater than left. 4.  No acute osseous injury of the lumbar spine.  TREATMENT TherEx -scifit hills level 8 x10 mins B UE/LE for LE conditioning -anterior ball roll out, B side 2x8 -iso core ball press down 2x8 -seated march over kettlebell 2x8 B LE -hooklying bridges 2x10 -hooklying marches 2x10 B LE -prone press up 2x10 -standing HSC body weight 2x8  -standing 6# medball D2 B UE 2x8                                                                                                                            PATIENT EDUCATION:  Education details: continue HEP Person educated: Patient and Spouse Education method: Explanation, Demonstration, Tactile cues, Verbal cues, and Handouts Education comprehension: verbalized understanding, returned demonstration, and needs further education  HOME EXERCISE PROGRAM: Access Code: [email or web address] URL: [email or web address] Date: 03/10/2023 Prepared by: Peter Congo  Exercises - Supine Sciatic Nerve  Glide  - 1 x daily - 7 x weekly - 1-2 sets - 10 reps - Supine Lower Trunk Rotation  - 1 x daily - 7 x weekly - 3 sets - 10 reps - Supine Posterior Pelvic Tilt  - 1 x daily - 7 x weekly - 1 sets - 10 reps - 5 sec hold - Supine March with Posterior Pelvic Tilt  - 1 x daily - 7 x weekly - 3 sets - 10 reps - Supine Bridge  - 1 x daily - 7 x weekly - 3 sets - 10 reps   ASSESSMENT:  CLINICAL IMPRESSION: Patient seen for skilled PT session with emphasis on general conditioning targeting core strength/endurance. Tolerated slow progression to more dynamic tasks with increased demand on core and would benefit from further progressions. Continue POC.    OBJECTIVE IMPAIRMENTS: decreased knowledge of condition, decreased mobility, decreased strength, increased fascial restrictions, improper body mechanics, postural dysfunction, obesity, and pain.   ACTIVITY LIMITATIONS: carrying, lifting, bending, sitting, standing, squatting, stairs, locomotion level, and caring for others  PARTICIPATION LIMITATIONS: community activity, occupation, and yard work  PERSONAL FACTORS: Behavior pattern, Fitness, Past/current experiences, Profession, Time since onset of injury/illness/exacerbation, and 1-2 comorbidities: see above  are also affecting patient's functional outcome.   REHAB POTENTIAL: Fair chronicity  CLINICAL DECISION MAKING: Stable/uncomplicated  EVALUATION COMPLEXITY: Low   GOALS: Goals reviewed with patient? Yes  SHORT TERM GOALS: Target date: 03/26/23  Pt will be independent with initial HEP for improved symptom report  Baseline: to be provided; provided Goal status: MET   LONG TERM GOALS: Target date: 04/16/23  Pt will be independent with final HEP for improved symptom report  Baseline: to be provided Goal status: INITIAL  2.  Patient will improve Oswestry score to </= 45% to demonstrate less disability  related to his back pain. Baseline: 54% Goal status: INITIAL   PLAN:  PT  FREQUENCY: 1x/week  PT DURATION: 6 weeks  PLANNED INTERVENTIONS: 97164- PT Re-evaluation, 97110-Therapeutic exercises, 97530- Therapeutic activity, 97112- Neuromuscular re-education, 97535- Self Care, 16109- Manual therapy, [phone]- Gait training, [phone]- Orthotic Fit/training, [phone]- Aquatic Therapy, [phone]- Electrical stimulation (manual), Patient/Family education, Balance training, Stair training, Taping, Dry Needling, Joint mobilization, Spinal mobilization, Vestibular training, Visual/preceptual remediation/compensation, Cognitive remediation, DME instructions, Cryotherapy, and Moist heat.  PLAN FOR NEXT SESSION: add to HEP PRN, R LE strengthening, sciatic nerve glides, did not like DN previously   Westley Foots, PT Westley Foots, PT, DPT, CBIS  03/24/2023, 1:53 PM

## 2023-03-30 ENCOUNTER — Encounter: Payer: Self-pay | Admitting: Family Medicine

## 2023-03-31 ENCOUNTER — Ambulatory Visit: Payer: 59 | Attending: Neurosurgery | Admitting: Physical Therapy

## 2023-03-31 DIAGNOSIS — M6281 Muscle weakness (generalized): Secondary | ICD-10-CM | POA: Diagnosis present

## 2023-03-31 DIAGNOSIS — R293 Abnormal posture: Secondary | ICD-10-CM | POA: Insufficient documentation

## 2023-03-31 DIAGNOSIS — M79604 Pain in right leg: Secondary | ICD-10-CM | POA: Insufficient documentation

## 2023-03-31 NOTE — Therapy (Signed)
 OUTPATIENT PHYSICAL THERAPY THORACOLUMBAR TREATMENT   Patient Name: Anthony Collier MRN: [id number] DOB:May 10, 1975, 48 y.o., male Today's Date: 03/31/2023  END OF SESSION:  PT End of Session - 03/31/23 1318     Visit Number 4    Number of Visits 7    Date for PT Re-Evaluation 04/16/23    Authorization Type aetna state    PT Start Time 1315    PT Stop Time 1353    PT Time Calculation (min) 38 min    Activity Tolerance Patient tolerated treatment well    Behavior During Therapy WFL for tasks assessed/performed               Past Medical History:  Diagnosis Date   Diabetes mellitus without complication (HCC)    Hypertension    No past surgical history on file. Patient Active Problem List   Diagnosis Date Noted   Encounter for immunization 03/01/2023   Sciatica 07/17/2022   Left knee pain 06/30/2022   Vertigo 11/14/2021   Mid back pain on right side 08/29/2021   Severe obstructive sleep apnea 05/07/2021   Essential hypertension 04/11/2021   Snoring 03/27/2021   Balanitis 03/07/2021   Hypertriglyceridemia 04/04/2020   Numbness of left lower extremity 09/23/2018   Obesity, morbid (HCC) 09/17/2017   Diabetes mellitus (HCC) 03/31/2017    PCP: Nelia Shi, MD  REFERRING PROVIDER: Tressie Stalker, MD  REFERRING DIAG: M51.27 (ICD-10-CM) - Herniated nucleus pulposus of lumbosacral region  Rationale for Evaluation and Treatment: Rehabilitation  THERAPY DIAG:  Abnormal posture  Muscle weakness (generalized)  Pain in right leg  ONSET DATE: 02/22/2023 referral  SUBJECTIVE:                                                                                                                                                                                           SUBJECTIVE STATEMENT: Pt reports he is feeling tired today, "I didn't want to come today". Pt reports his pain is usually ok until he comes to PT, stretches aggravate his pain. No pain at rest today. Pt  reports that no movements or exercises have helped him to feel better.  Accompanied by: wife  PERTINENT HISTORY:  HTN, DMII  PAIN:  Are you having pain? No  PRECAUTIONS: None   PATIENT GOALS: "this is mandatory"  OBJECTIVE:  Note: Objective measures were completed at Evaluation unless otherwise noted.  DIAGNOSTIC FINDINGS:  IMPRESSION: 1. At L5-S1 there is a broad-based disc bulge with a large right subarticular disc extrusion and mass effect on the right intraspinal S1 nerve root. Narrowing of the left lateral recess. Moderate spinal stenosis. Moderate bilateral facet  arthropathy. Severe right foraminal stenosis. Moderate-severe left foraminal stenosis. 2. At L4-5 there is a mild broad-based disc bulge. Moderate bilateral facet arthropathy. Mild bilateral foraminal stenosis. 3. At L3-4 there is mild bilateral facet arthropathy. Mild bilateral foraminal stenosis, right greater than left. 4.  No acute osseous injury of the lumbar spine.   TREATMENT TherEx SciFit multi-peaks level 5 for 8 minutes using BUE/BLEs for neural priming for reciprocal movement, dynamic cardiovascular warmup and increased amplitude of stepping. RPE of 7/10 following activity with soreness in leg muscles following exercise.  To work on core strengthening: Sit to stand with chest press medicine ball 8# 2 x 10 reps Onset of "pinching" in back of RLE Attempted seated R sciatic nerve glide, no relief with this Transitioned to supine R sciatic nerve glides 3 x 5 reps Seated R hamstring stretch 3 x 30 sec each (added to HEP) Seated OH lift 2 x 10 reps with 8# medicine ball Standing L/R diagonal chops 2 x 10 reps B with 8# medicine ball Palloff press with blue resistance band 2 x 10 reps B from R/L side                                                                                                                            PATIENT EDUCATION:  Education details: continue HEP, added to HEP Person  educated: Patient and Spouse Education method: Explanation, Demonstration, Actor cues, Verbal cues, and Handouts Education comprehension: verbalized understanding, returned demonstration, and needs further education  HOME EXERCISE PROGRAM: Access Code: [email or web address] URL: [email or web address] Date: 03/10/2023 Prepared by: Peter Congo  Exercises - Supine Sciatic Nerve Glide  - 1 x daily - 7 x weekly - 1-2 sets - 10 reps - Supine Lower Trunk Rotation  - 1 x daily - 7 x weekly - 3 sets - 10 reps - Supine Posterior Pelvic Tilt  - 1 x daily - 7 x weekly - 1 sets - 10 reps - 5 sec hold - Supine March with Posterior Pelvic Tilt  - 1 x daily - 7 x weekly - 3 sets - 10 reps - Supine Bridge  - 1 x daily - 7 x weekly - 3 sets - 10 reps - Seated Hamstring Stretch  - 1 x daily - 7 x weekly - 1 sets - 3-5 reps - 30-60 sec hold   ASSESSMENT:  CLINICAL IMPRESSION: Emphasis of skilled PT session on continuing to work on core strengthening and on addressing sciatic nerve pain and R HS tightness in order to reduce pain symptoms. Pt continues to demonstrate poor buy-in to benefits of PT and importance of working on stretches and exercises in order to reduce his pain but does willingly participate during session. Continue POC.    OBJECTIVE IMPAIRMENTS: decreased knowledge of condition, decreased mobility, decreased strength, increased fascial restrictions, improper body mechanics, postural dysfunction, obesity, and pain.   ACTIVITY LIMITATIONS: carrying, lifting, bending, sitting, standing, squatting, stairs, locomotion level, and caring for others  PARTICIPATION LIMITATIONS: community activity, occupation, and yard work  PERSONAL FACTORS: Behavior pattern, Fitness, Past/current experiences, Profession, Time since onset of injury/illness/exacerbation, and 1-2 comorbidities: see above  are also affecting patient's functional outcome.   REHAB POTENTIAL: Fair chronicity  CLINICAL DECISION  MAKING: Stable/uncomplicated  EVALUATION COMPLEXITY: Low   GOALS: Goals reviewed with patient? Yes  SHORT TERM GOALS: Target date: 03/26/23  Pt will be independent with initial HEP for improved symptom report  Baseline: to be provided; provided Goal status: MET   LONG TERM GOALS: Target date: 04/16/23  Pt will be independent with final HEP for improved symptom report  Baseline: to be provided Goal status: INITIAL  2.  Patient will improve Oswestry score to </= 45% to demonstrate less disability related to his back pain. Baseline: 54% Goal status: INITIAL   PLAN:  PT FREQUENCY: 1x/week  PT DURATION: 6 weeks  PLANNED INTERVENTIONS: 97164- PT Re-evaluation, 97110-Therapeutic exercises, 97530- Therapeutic activity, 97112- Neuromuscular re-education, 97535- Self Care, 14782- Manual therapy, [phone]- Gait training, [phone]- Orthotic Fit/training, [phone]- Aquatic Therapy, [phone]- Electrical stimulation (manual), Patient/Family education, Balance training, Stair training, Taping, Dry Needling, Joint mobilization, Spinal mobilization, Vestibular training, Visual/preceptual remediation/compensation, Cognitive remediation, DME instructions, Cryotherapy, and Moist heat.  PLAN FOR NEXT SESSION: add to HEP PRN, R LE strengthening, sciatic nerve glides, did not like DN previously   Peter Congo, PT Peter Congo, PT, DPT, CSRS   03/31/2023, 1:53 PM

## 2023-04-05 ENCOUNTER — Encounter: Payer: Self-pay | Admitting: Family Medicine

## 2023-04-05 ENCOUNTER — Telehealth: Payer: Self-pay

## 2023-04-05 NOTE — Telephone Encounter (Signed)
 Pharmacy Patient Advocate Encounter   Received notification from CoverMyMeds that prior authorization for TRULICITY is required/requested.   Insurance verification completed.   The patient is insured through [facility] .   PA required; PA started via CoverMyMeds. KEY [id number] . Waiting for clinical questions to populate.

## 2023-04-06 ENCOUNTER — Other Ambulatory Visit: Payer: Self-pay | Admitting: Family Medicine

## 2023-04-06 DIAGNOSIS — Z794 Long term (current) use of insulin: Secondary | ICD-10-CM

## 2023-04-06 NOTE — Telephone Encounter (Signed)
 Pharmacy Patient Advocate Encounter  Received notification from [facility] that Prior Authorization for TRULICITY has been DENIED.  Full denial letter will be uploaded to the media tab. See denial reason below.  Your plan only covers this drug when you have tried another drug that your plan covers (preferred drug) and it did not work well for you. Possible alternative drug(s) may be: metformin.   PA #/Case ID/Reference #: [id number]

## 2023-04-07 ENCOUNTER — Ambulatory Visit: Payer: 59 | Admitting: Physical Therapy

## 2023-04-07 DIAGNOSIS — M6281 Muscle weakness (generalized): Secondary | ICD-10-CM

## 2023-04-07 DIAGNOSIS — R293 Abnormal posture: Secondary | ICD-10-CM

## 2023-04-07 DIAGNOSIS — M79604 Pain in right leg: Secondary | ICD-10-CM

## 2023-04-07 NOTE — Therapy (Signed)
 OUTPATIENT PHYSICAL THERAPY THORACOLUMBAR TREATMENT   Patient Name: Anthony Collier MRN: [id number] DOB:February 13, 1975, 48 y.o., male Today's Date: 04/07/2023  END OF SESSION:  PT End of Session - 04/07/23 1324     Visit Number 5    Number of Visits 7    Date for PT Re-Evaluation 04/16/23    Authorization Type aetna state    PT Start Time 1320    PT Stop Time 1400    PT Time Calculation (min) 40 min    Activity Tolerance Patient tolerated treatment well    Behavior During Therapy WFL for tasks assessed/performed             Past Medical History:  Diagnosis Date   Diabetes mellitus without complication (HCC)    Hypertension    No past surgical history on file. Patient Active Problem List   Diagnosis Date Noted   Encounter for immunization 03/01/2023   Sciatica 07/17/2022   Left knee pain 06/30/2022   Vertigo 11/14/2021   Mid back pain on right side 08/29/2021   Severe obstructive sleep apnea 05/07/2021   Essential hypertension 04/11/2021   Snoring 03/27/2021   Balanitis 03/07/2021   Hypertriglyceridemia 04/04/2020   Numbness of left lower extremity 09/23/2018   Obesity, morbid (HCC) 09/17/2017   Diabetes mellitus (HCC) 03/31/2017    PCP: Nelia Shi, MD  REFERRING PROVIDER: Tressie Stalker, MD  REFERRING DIAG: M51.27 (ICD-10-CM) - Herniated nucleus pulposus of lumbosacral region  Rationale for Evaluation and Treatment: Rehabilitation  THERAPY DIAG:  Abnormal posture  Muscle weakness (generalized)  Pain in right leg  ONSET DATE: 02/22/2023 referral  SUBJECTIVE:                                                                                                                                                                                           SUBJECTIVE STATEMENT: Pt reports he is feeling tired today, "I didn't want to come today". Pt reports his pain is usually ok until he comes to PT, stretches aggravate his pain. No pain at rest today. Pt  reports that no movements or exercises have helped him to feel better.  Accompanied by: wife  PERTINENT HISTORY:  HTN, DMII  PAIN:  Are you having pain? No  PRECAUTIONS: None   PATIENT GOALS: "this is mandatory"  OBJECTIVE:  Note: Objective measures were completed at Evaluation unless otherwise noted.  DIAGNOSTIC FINDINGS:  IMPRESSION: 1. At L5-S1 there is a broad-based disc bulge with a large right subarticular disc extrusion and mass effect on the right intraspinal S1 nerve root. Narrowing of the left lateral recess. Moderate spinal stenosis. Moderate bilateral facet arthropathy. Severe  right foraminal stenosis. Moderate-severe left foraminal stenosis. 2. At L4-5 there is a mild broad-based disc bulge. Moderate bilateral facet arthropathy. Mild bilateral foraminal stenosis. 3. At L3-4 there is mild bilateral facet arthropathy. Mild bilateral foraminal stenosis, right greater than left. 4.  No acute osseous injury of the lumbar spine.   TREATMENT TherEx Treadmill 1.6 mph x 3 min fwd, 0.9 mph x 3 min bwd Counter plank 2x30" Side counter plank 2x30" Palloff press green TB doubled 2x10 Shoulder ext reactive iso green TB 2x10 Leg press 120# 2x10                                                                                                                            PATIENT EDUCATION:  Education details: continue HEP, added to HEP, discussed sitting positions and positions to avoid to relieve pressure on the disc Person educated: Patient and Spouse Education method: Explanation, Demonstration, Tactile cues, Verbal cues, and Handouts Education comprehension: verbalized understanding, returned demonstration, and needs further education  HOME EXERCISE PROGRAM: Access Code: [email or web address] URL: [email or web address] Date: 03/10/2023 Prepared by: Peter Congo  Exercises - Supine Sciatic Nerve Glide  - 1 x daily - 7 x weekly - 1-2 sets - 10 reps - Supine Lower  Trunk Rotation  - 1 x daily - 7 x weekly - 3 sets - 10 reps - Supine Posterior Pelvic Tilt  - 1 x daily - 7 x weekly - 1 sets - 10 reps - 5 sec hold - Supine March with Posterior Pelvic Tilt  - 1 x daily - 7 x weekly - 3 sets - 10 reps - Supine Bridge  - 1 x daily - 7 x weekly - 3 sets - 10 reps - Seated Hamstring Stretch  - 1 x daily - 7 x weekly - 1 sets - 3-5 reps - 30-60 sec hold   ASSESSMENT:  CLINICAL IMPRESSION: Continued to work on core stability this session. Discussed positioning to decrease pressure on his discs.    OBJECTIVE IMPAIRMENTS: decreased knowledge of condition, decreased mobility, decreased strength, increased fascial restrictions, improper body mechanics, postural dysfunction, obesity, and pain.   ACTIVITY LIMITATIONS: carrying, lifting, bending, sitting, standing, squatting, stairs, locomotion level, and caring for others  PARTICIPATION LIMITATIONS: community activity, occupation, and yard work  PERSONAL FACTORS: Behavior pattern, Fitness, Past/current experiences, Profession, Time since onset of injury/illness/exacerbation, and 1-2 comorbidities: see above  are also affecting patient's functional outcome.   REHAB POTENTIAL: Fair chronicity  CLINICAL DECISION MAKING: Stable/uncomplicated  EVALUATION COMPLEXITY: Low   GOALS: Goals reviewed with patient? Yes  SHORT TERM GOALS: Target date: 03/26/23  Pt will be independent with initial HEP for improved symptom report  Baseline: to be provided; provided Goal status: MET   LONG TERM GOALS: Target date: 04/16/23  Pt will be independent with final HEP for improved symptom report  Baseline: to be provided Goal status: INITIAL  2.  Patient will improve Oswestry score to </= 45% to demonstrate less  disability related to his back pain. Baseline: 54% Goal status: INITIAL   PLAN:  PT FREQUENCY: 1x/week  PT DURATION: 6 weeks  PLANNED INTERVENTIONS: 97164- PT Re-evaluation, 97110-Therapeutic exercises,  97530- Therapeutic activity, 97112- Neuromuscular re-education, 97535- Self Care, 40981- Manual therapy, [phone]- Gait training, [phone]- Orthotic Fit/training, [phone]- Aquatic Therapy, [phone]- Electrical stimulation (manual), Patient/Family education, Balance training, Stair training, Taping, Dry Needling, Joint mobilization, Spinal mobilization, Vestibular training, Visual/preceptual remediation/compensation, Cognitive remediation, DME instructions, Cryotherapy, and Moist heat.  PLAN FOR NEXT SESSION: add to HEP PRN, R LE strengthening, sciatic nerve glides, did not like DN previously   [facility] April Ma L Wen [place], PT 04/07/2023, 1:25 PM

## 2023-04-08 ENCOUNTER — Encounter: Payer: Self-pay | Admitting: Family Medicine

## 2023-04-09 ENCOUNTER — Other Ambulatory Visit (HOSPITAL_COMMUNITY): Payer: Self-pay

## 2023-04-14 ENCOUNTER — Ambulatory Visit: Payer: 59

## 2023-04-14 DIAGNOSIS — R293 Abnormal posture: Secondary | ICD-10-CM | POA: Diagnosis not present

## 2023-04-14 DIAGNOSIS — M6281 Muscle weakness (generalized): Secondary | ICD-10-CM

## 2023-04-14 NOTE — Therapy (Signed)
 OUTPATIENT PHYSICAL THERAPY THORACOLUMBAR TREATMENT/ DISCHARGE SUMMARY   Patient Name: Anthony Collier MRN: [id number] DOB:04/18/1975, 48 y.o., male Today's Date: 04/14/2023  PHYSICAL THERAPY DISCHARGE SUMMARY  Visits from Start of Care: 6  Current functional level related to goals / functional outcomes: Same as eval   Remaining deficits: Intractable LBP   Education / Equipment: PT POC, HEP, pathophys of dx   Patient agrees to discharge. Patient goals were not met. Patient is being discharged due to did not respond to therapy.   END OF SESSION:  PT End of Session - 04/14/23 1317     Visit Number 6    Number of Visits 7    Date for PT Re-Evaluation 04/16/23    Authorization Type aetna state    PT Start Time 1316    PT Stop Time 1328    PT Time Calculation (min) 12 min    Activity Tolerance Patient tolerated treatment well    Behavior During Therapy WFL for tasks assessed/performed             Past Medical History:  Diagnosis Date   Diabetes mellitus without complication (HCC)    Hypertension    History reviewed. No pertinent surgical history. Patient Active Problem List   Diagnosis Date Noted   Encounter for immunization 03/01/2023   Sciatica 07/17/2022   Left knee pain 06/30/2022   Vertigo 11/14/2021   Mid back pain on right side 08/29/2021   Severe obstructive sleep apnea 05/07/2021   Essential hypertension 04/11/2021   Snoring 03/27/2021   Balanitis 03/07/2021   Hypertriglyceridemia 04/04/2020   Numbness of left lower extremity 09/23/2018   Obesity, morbid (HCC) 09/17/2017   Diabetes mellitus (HCC) 03/31/2017    PCP: Nelia Shi, MD  REFERRING PROVIDER: Tressie Stalker, MD  REFERRING DIAG: M51.27 (ICD-10-CM) - Herniated nucleus pulposus of lumbosacral region  Rationale for Evaluation and Treatment: Rehabilitation  THERAPY DIAG:  Abnormal posture  Muscle weakness (generalized)  ONSET DATE: 02/22/2023 referral  SUBJECTIVE:                                                                                                                                                                                            SUBJECTIVE STATEMENT: Patient reports doing "the same." Denies changes or falls. He is agreeable to dc from PT.   Accompanied by: wife  PERTINENT HISTORY:  HTN, DMII  PAIN:  Are you having pain? No  PRECAUTIONS: None   PATIENT GOALS: "this is mandatory"  OBJECTIVE:  Note: Objective measures were completed at Evaluation unless otherwise noted.  DIAGNOSTIC FINDINGS:  IMPRESSION: 1. At L5-S1 there is a broad-based disc  bulge with a large right subarticular disc extrusion and mass effect on the right intraspinal S1 nerve root. Narrowing of the left lateral recess. Moderate spinal stenosis. Moderate bilateral facet arthropathy. Severe right foraminal stenosis. Moderate-severe left foraminal stenosis. 2. At L4-5 there is a mild broad-based disc bulge. Moderate bilateral facet arthropathy. Mild bilateral foraminal stenosis. 3. At L3-4 there is mild bilateral facet arthropathy. Mild bilateral foraminal stenosis, right greater than left. 4.  No acute osseous injury of the lumbar spine.   TREATMENT Theract:   -oswestry: 52%                                                                                                                           PATIENT EDUCATION:  Education details: exam findings Person educated: Patient and Spouse Education method: Explanation, Demonstration, Tactile cues, Verbal cues, and Handouts Education comprehension: verbalized understanding, returned demonstration, and needs further education  HOME EXERCISE PROGRAM: Access Code: [email or web address] URL: [email or web address] Date: 03/10/2023 Prepared by: Peter Congo  Exercises - Supine Sciatic Nerve Glide  - 1 x daily - 7 x weekly - 1-2 sets - 10 reps - Supine Lower Trunk Rotation  - 1 x daily - 7 x weekly - 3 sets - 10  reps - Supine Posterior Pelvic Tilt  - 1 x daily - 7 x weekly - 1 sets - 10 reps - 5 sec hold - Supine March with Posterior Pelvic Tilt  - 1 x daily - 7 x weekly - 3 sets - 10 reps - Supine Bridge  - 1 x daily - 7 x weekly - 3 sets - 10 reps - Seated Hamstring Stretch  - 1 x daily - 7 x weekly - 1 sets - 3-5 reps - 30-60 sec hold   ASSESSMENT:  CLINICAL IMPRESSION: Patient seen for skilled PT session with emphasis on goal assessment and dc. He did not met his LTG and did not truly progress toward this goal. He has met the minimum requirement for PT for insurance auth for his back sx. Patient to dc from PT.    OBJECTIVE IMPAIRMENTS: decreased knowledge of condition, decreased mobility, decreased strength, increased fascial restrictions, improper body mechanics, postural dysfunction, obesity, and pain.   ACTIVITY LIMITATIONS: carrying, lifting, bending, sitting, standing, squatting, stairs, locomotion level, and caring for others  PARTICIPATION LIMITATIONS: community activity, occupation, and yard work  PERSONAL FACTORS: Behavior pattern, Fitness, Past/current experiences, Profession, Time since onset of injury/illness/exacerbation, and 1-2 comorbidities: see above  are also affecting patient's functional outcome.   REHAB POTENTIAL: Fair chronicity  CLINICAL DECISION MAKING: Stable/uncomplicated  EVALUATION COMPLEXITY: Low   GOALS: Goals reviewed with patient? Yes  SHORT TERM GOALS: Target date: 03/26/23  Pt will be independent with initial HEP for improved symptom report  Baseline: to be provided; provided Goal status: MET   LONG TERM GOALS: Target date: 04/16/23  Pt will be independent with final HEP for improved symptom report  Baseline: to be provided; provided Goal status: MET  2.  Patient will improve Oswestry score to </= 45% to demonstrate less disability related to his back pain. Baseline: 54%, 52% Goal status: NOT MET   PLAN:  PT FREQUENCY: 1x/week  PT  DURATION: 6 weeks  PLANNED INTERVENTIONS: 97164- PT Re-evaluation, 97110-Therapeutic exercises, 97530- Therapeutic activity, 97112- Neuromuscular re-education, 97535- Self Care, 81191- Manual therapy, [phone]- Gait training, [phone]- Orthotic Fit/training, [phone]- Aquatic Therapy, [phone]- Electrical stimulation (manual), Patient/Family education, Balance training, Stair training, Taping, Dry Needling, Joint mobilization, Spinal mobilization, Vestibular training, Visual/preceptual remediation/compensation, Cognitive remediation, DME instructions, Cryotherapy, and Moist heat.  PLAN FOR NEXT SESSION: dc from PT   Westley Foots, PT Westley Foots, PT, DPT, CBIS  04/14/2023, 1:35 PM

## 2023-04-22 ENCOUNTER — Encounter: Payer: Self-pay | Admitting: Family Medicine

## 2023-04-28 ENCOUNTER — Telehealth: Payer: Self-pay | Admitting: Family Medicine

## 2023-04-28 NOTE — Telephone Encounter (Signed)
 Spoke with patient given MyChart messages received 4/2 about new onset swelling in RLE.  Patient reports swelling localized to right leg for the past few days, was fairly profound over the weekend, foot felt like it "might pop."  Appears to get worse if sitting or a longer period of time.  No swelling in LLE that he can see.  No pain in R leg, but a sensation of pressure and discomfort.  This is the same leg experiencing the weakness related to his lumbar nerve impingement.  Has never had anything like this and no history of DVTs or other clots.  Denies dyspnea, chest pain.  Concern for DVT versus some form of venous insufficiency, though would not be acute onset normally.  No evidence of PE on history, but difficult to assess over phone.  Needs to be seen imminently.  Discussed ED with patient and he would prefer to be seen in clinic.  Scheduled for next available appointment, which is tomorrow with Dr. Barb Merino at 1:50 PM.  Strict ED precautions given if he experiences dyspnea, chest pain, or RLE pain with worsening edema or erythema.

## 2023-04-29 ENCOUNTER — Encounter: Payer: Self-pay | Admitting: Family Medicine

## 2023-04-29 ENCOUNTER — Ambulatory Visit: Admitting: Family Medicine

## 2023-04-29 ENCOUNTER — Telehealth: Payer: Self-pay | Admitting: Family Medicine

## 2023-04-29 VITALS — BP 123/71 | HR 87 | Ht 74.0 in | Wt 396.4 lb

## 2023-04-29 DIAGNOSIS — R2241 Localized swelling, mass and lump, right lower limb: Secondary | ICD-10-CM | POA: Diagnosis not present

## 2023-04-29 LAB — D-DIMER, QUANTITATIVE: D-DIMER: 0.22 mg{FEU}/L (ref 0.00–0.49)

## 2023-04-29 NOTE — Patient Instructions (Signed)
 I will let you know if any results from today are abnormal   Please see the attached information for other interventions you can do at home for edema  Please let us know if symptoms get worse, or you have any trouble breathing or chest pain

## 2023-04-29 NOTE — Progress Notes (Signed)
    SUBJECTIVE:   CHIEF COMPLAINT / HPI:   RLE edema Ongoing for a few days, most significantly noticed this past weekend Worse with extended periods of sitting down.  Improves with compression, elevation, lying down No significant pain although when it was swollen this past weekend it did get a little tight Occasionally feels some numbness but this usually improves with movement  Currently, swelling is gone down though there is some persistent swelling around the ankle  No recent injuries/trauma  Denies orthopnea, paroxysmal nocturnal dyspnea  Reports left leg has been chronically swollen compared to right, since he was a kid  Denies any chest pain, shortness of breath, calf pain   PERTINENT  PMH / PSH: HTN, sleep apnea, diabetes, obesity  OBJECTIVE:   BP 123/71   Pulse 87   Ht 6\' 2"  (1.88 m)   Wt (!) 396 lb 6.4 oz (179.8 kg)   SpO2 98%   BMI 50.89 kg/m   General: NAD, pleasant, able to participate in exam Respiratory: No respiratory distress Skin: warm and dry, no rashes noted Psych: Normal affect and mood  Lower extremities: LLE larger compared to right thigh patient reports his chronic.  Nontender throughout calf, ankle, foot  RLE: Mild nonpitting edema around bilateral malleoli.  No significant pitting edema throughout the leg/shin.  No significant calf tenderness.  No tenderness throughout Achilles, heel, foot.  FROM without pain.  ASSESSMENT/PLAN:   Assessment & Plan Localized swelling of right lower extremity Low risk Wells, but given reported symptoms obtain d-dimer. If elevated will have pt obtain stat DVT US. Otherwise suspect more likely to be dependent edema given reported association with gravity and improvement with compression/elevation. Discussed supportive care for this and return precautions. Lower suspicion for cardiac cause. No obvious sign of PE.   Vonna Drafts, MD [facility]

## 2023-04-30 ENCOUNTER — Encounter: Payer: Self-pay | Admitting: Family Medicine

## 2023-04-30 NOTE — Telephone Encounter (Signed)
**   AFTER HOURS PAGE **  Received page regarding stat D-dimer ordered by Dr. Josephine Igo at 04/29/2023 office visit for concern for lower extremity DVT.  D-dimer 0.22, normal.  Will forward to ordering physician for further follow-up if indicated.  Elberta Fortis, DO

## 2023-05-06 ENCOUNTER — Other Ambulatory Visit: Payer: Self-pay | Admitting: Family Medicine

## 2023-05-06 DIAGNOSIS — E781 Pure hyperglyceridemia: Secondary | ICD-10-CM

## 2023-06-29 ENCOUNTER — Encounter: Payer: Self-pay | Admitting: Family Medicine

## 2023-07-18 ENCOUNTER — Other Ambulatory Visit: Payer: Self-pay | Admitting: Family Medicine

## 2023-07-18 DIAGNOSIS — I10 Essential (primary) hypertension: Secondary | ICD-10-CM

## 2023-07-21 NOTE — Telephone Encounter (Signed)
 Patient calls nurse line in regards to extended work note.   He reports he needs this by Friday 6/27. He reports he is due to FU with PCP in August. Attempted to make apt, however the schedule is not out as of yet.   Will forward to PCP.   Please let me know if he needs a sooner apt.

## 2023-08-24 ENCOUNTER — Ambulatory Visit: Admitting: Family Medicine

## 2023-08-24 NOTE — Progress Notes (Deleted)
   SUBJECTIVE:   CHIEF COMPLAINT / HPI:  Anthony Collier is a 48 y.o. male with a pertinent past medical history of *** presenting to the clinic for   Type 2 diabetes mellitus Last A1c 6.8 on 03/01/2023 Home CBGs: *** Medications: Trulicity  3 mg weekly Hypoglycemia plan: *** Adherence: *** Eye exam: ***Overdue, reports no vision coverage, will try to see optometry Foot exam: UTD Microalbumin: Obtained today Statin: Atorvastatin  10 mg *** symptoms of hypoglycemia, polyuria, polydipsia, numbness of extremities, foot ulcers/trauma  Hypertension   today. Home medications include:  Losartan  25 mg daily He {Blank single:19197::endorses,does not endorse} taking these medications as prescribed. {Blank single:19197::Does,Does not} check blood pressure at home, these have ranged ***. Denies hypotension symptoms including dizziness and orthostatic symptoms. Denies headache, vision changes, LUQ pain, hematuria, chest pain. Diet: ***. Exercise: ***.  Back pain, sciatica Patient has been working towards lower back surgery for his persistent sciatica. Has been virtually unable to work. Has been declined by insurance for surgery and instead pursuing physical therapy course. Onset: *** Location: *** Duration/timing: *** Characteristics: *** Aggravating factors: *** Treatments: ***  RLE swelling Seen in clinic 4/3 for RLE swelling. Ddimer negative, Wells score low and no DVT US  obtained. Today, ***   PERTINENT PMH / PSH: PMH of T2DM on Trulicity , OSA, HTN, sciatica. 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 Now ***trying to pivot to different department that will allow him to work more. ***Of note, patient was required to pay $120 copay for colonoscopy upfront, but was unable to do so due to being out of work.  Also unable to go to Ophthalmology visit due to lack of vision  coverage.  *Remainder reviewed in problem list.   OBJECTIVE:   There were no vitals taken for this visit.  General: Age-appropriate, resting comfortably in chair, NAD, alert and at baseline. HEENT:  Head: Normocephalic, atraumatic. No tenderness to percussion over sinuses. Eyes: PERRLA. No conjunctival erythema or scleral injections. Ears: TMs non-bulging and non-erythematous bilaterally. No erythema of external ear canal. No cerumen impaction. Nose: Non-erythematous turbinates. No rhinorrhea. Mouth/Oral: Clear, no tonsillar exudate. MMM. Neck: Supple. No LAD. Cardiovascular: Regular rate and rhythm. Normal S1/S2. No murmurs, rubs, or gallops appreciated. 2+ radial pulses. Pulmonary: Clear bilaterally to ascultation. No wheezes, crackles, or rhonchi. Normal WOB on room air. No accessory muscle use. Abdominal: No tenderness to deep or light palpation. No rebound or guarding. No HSM. Skin: Warm and dry. Extremities: No peripheral edema bilaterally. Capillary refill <2 seconds. MSK: ***  ASSESSMENT/PLAN:   Assessment & Plan Type 2 diabetes mellitus without complication, with long-term current use of insulin  (HCC)   No follow-ups on file.  Floyd Wade Toma, MD [facility]

## 2023-08-31 ENCOUNTER — Ambulatory Visit: Payer: Self-pay | Admitting: Family Medicine

## 2023-08-31 ENCOUNTER — Encounter: Payer: Self-pay | Admitting: Family Medicine

## 2023-08-31 ENCOUNTER — Ambulatory Visit: Admitting: Family Medicine

## 2023-08-31 VITALS — BP 135/75 | HR 78 | Ht 74.0 in | Wt 391.4 lb

## 2023-08-31 DIAGNOSIS — E119 Type 2 diabetes mellitus without complications: Secondary | ICD-10-CM

## 2023-08-31 DIAGNOSIS — I10 Essential (primary) hypertension: Secondary | ICD-10-CM | POA: Diagnosis not present

## 2023-08-31 DIAGNOSIS — Z794 Long term (current) use of insulin: Secondary | ICD-10-CM

## 2023-08-31 DIAGNOSIS — M5431 Sciatica, right side: Secondary | ICD-10-CM

## 2023-08-31 LAB — POCT GLYCOSYLATED HEMOGLOBIN (HGB A1C): HbA1c, POC (controlled diabetic range): 7 % (ref 0.0–7.0)

## 2023-08-31 MED ORDER — GABAPENTIN 300 MG PO CAPS: 300.0000 mg | ORAL_CAPSULE | Freq: Three times a day (TID) | ORAL | 3 refills | Status: AC

## 2023-08-31 NOTE — Assessment & Plan Note (Addendum)
 Well-controlled. - Continue losartan  25 mg daily - BMP today

## 2023-08-31 NOTE — Progress Notes (Cosign Needed Addendum)
 SUBJECTIVE:   CHIEF COMPLAINT / HPI:  Anthony Collier is a 48 y.o. male with a pertinent past medical history of T2DM, obesity, HTN, and sciatica presenting to the clinic for follow up of chronic conditions.  Type 2 diabetes mellitus Last A1c 6.8 on 03/01/2023 Medications: Trulicity  3 mg weekly Adherence: Good Eye exam: Overdue, reports no vision coverage, will try to see optometry Foot exam: UTD Microalbumin: Obtained today Statin: Atorvastatin  10 mg No symptoms of hypoglycemia, polyuria, polydipsia, numbness of extremities, foot ulcers/trauma  Hypertension BP: 135/75 today. Home medications include:  Losartan  25 mg daily He endorses taking these medications as prescribed. Does not check blood pressure at home. Denies hypotension symptoms including dizziness and orthostatic symptoms. Denies headache, vision changes, LUQ pain, hematuria, chest pain. Diet: Reduce sodium. Exercise: Minimal given pain and out of work.  Back pain, sciatica Patient has been working towards lower back surgery for his persistent sciatica. Has been virtually unable to work. Has been declined by insurance for surgery and instead pursuing physical therapy course. Onset: Chronic, through mildly improved over the past few months, PT did not help; reports he is getting more used to it Location: RLE sciatic nerve radiation Duration/timing: Intermittent, occurs when moving Characteristics: Dull pain with some calf weakness, some electrical nerve radiation from buttocks down through thigh Aggravating factors: Lots of walking, lifting objects Treatments: PT, muscle relaxers, attempted weight loss  RLE swelling Seen in clinic 4/3 for RLE swelling. Ddimer negative, Wells score low and no DVT US  obtained. Today, patient reports the issue has resolved and has not recurred.   PERTINENT PMH / PSH: PMH of T2DM on Trulicity , OSA, HTN, sciatica. 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  *Remainder reviewed in problem list.   OBJECTIVE:   BP 135/75   Pulse 78   Ht 6' 2 (1.88 m)   Wt (!) 391 lb 6.4 oz (177.5 kg)   SpO2 95%   BMI 50.25 kg/m   General: Age-appropriate, resting comfortably in chair, NAD, alert and at baseline. Cardiovascular: Regular rate and rhythm. Normal S1/S2. No murmurs, rubs, or gallops appreciated. 2+ radial pulses. Pulmonary: Clear bilaterally to ascultation. No wheezes, crackles, or rhonchi. Normal WOB on room air. No accessory muscle use. Skin: Warm and dry. Extremities: L calf > R, but this is chronic from past exams and patient reports this has been the case as long as he can remember. No peripheral edema bilaterally. Capillary refill <2 seconds. MSK: Full ROM lower extremities, 5/5 strength BLE.  Mildly antalgic gait.   ASSESSMENT/PLAN:   Assessment & Plan Type 2 diabetes mellitus without complication, with long-term current use of insulin  (HCC) Overall controlled with goal A1c less than 7. - Continue Trulicity  weekly injections  - Can look into switching to tirzepatide for better weight loss in the future - Microalbumin/creatinine ratio and creatinine today - Provided precautions to patient, discussed importance of caution with operating heavy machinery or driving given possible sedation from gabapentin  Essential hypertension Well-controlled. - Continue losartan  25 mg daily - BMP today Sciatica of right side Chronic, reportedly would not be able to pursue surgery due to insurance coverage issues. - Start gabapentin  300 mg 3 times daily for nerve pain - Note provided to  patient that he can return to work 09/13/2023 per his request - Recommend patient avoid heavy lifting,  though can return to work without specific restrictions; use his body as an indicator of what he can and cannot do  Anthony Longshore Toma, MD [facility]

## 2023-08-31 NOTE — Patient Instructions (Signed)
 It was great to see you today! Thank you for choosing [facility] for your primary care.  Today we addressed: Diabetes Great job with your diabetes, your A1c is holding stable.  I think you will have more exercise as you go back to work.  2. Sciatica of right side Start taking the gabapentin  300 mg three times a day (you can try at the end of your work day, before bed, and right when you wake up to avoid drowsiness).  You can still work while taking this medicine, but you need to be careful with operating heavy machinery and driving.  Be aware that it can make you sleepy.  We are checking some labs today, including kidney function and urine labs.  You will get a MyChart message or a letter if results are normal. Otherwise, you will get a call from us .  You should return to our clinic in 3 months to follow up on your sciatica.  Thank you for coming to see us  at [facility] and for the opportunity to care for you! Toma Aanika Defoor, MD 08/31/2023, 9:51 AM

## 2023-08-31 NOTE — Assessment & Plan Note (Signed)
 Chronic, reportedly would not be able to pursue surgery due to insurance coverage issues. - Start gabapentin  300 mg 3 times daily for nerve pain - Note provided to patient that he can return to work 09/13/2023 per his request - Recommend patient avoid heavy lifting, though can return to work without specific restrictions; use his body as an indicator of what he can and cannot do

## 2023-08-31 NOTE — Assessment & Plan Note (Addendum)
 Overall controlled with goal A1c less than 7. - Continue Trulicity  weekly injections  - Can look into switching to tirzepatide for better weight loss in the future - Microalbumin/creatinine ratio and creatinine today - Provided precautions to patient, discussed importance of caution with operating heavy machinery or driving given possible sedation from gabapentin 

## 2023-09-01 LAB — BASIC METABOLIC PANEL WITH GFR
BUN/Creatinine Ratio: 10 (ref 9–20)
BUN: 9 mg/dL (ref 6–24)
CO2: 21 mmol/L (ref 20–29)
Calcium: 8.9 mg/dL (ref 8.7–10.2)
Chloride: 102 mmol/L (ref 96–106)
Creatinine, Ser: 0.93 mg/dL (ref 0.76–1.27)
Glucose: 137 mg/dL — ABNORMAL HIGH (ref 70–99)
Potassium: 4.3 mmol/L (ref 3.5–5.2)
Sodium: 139 mmol/L (ref 134–144)
eGFR: 103 mL/min/1.73 (ref >59)

## 2023-09-01 LAB — MICROALBUMIN / CREATININE URINE RATIO
Creatinine, Urine: 423.4 mg/dL
Microalb/Creat Ratio: 3 mg/g{creat} (ref 0–29)
Microalbumin, Urine: 12.5 ug/mL

## 2023-09-10 ENCOUNTER — Encounter: Payer: Self-pay | Admitting: Family Medicine

## 2023-09-10 MED ORDER — CYCLOBENZAPRINE HCL 10 MG PO TABS: 10.0000 mg | ORAL_TABLET | Freq: Three times a day (TID) | ORAL | 0 refills | Status: AC | PRN

## 2023-10-20 ENCOUNTER — Encounter: Payer: Self-pay | Admitting: Family Medicine

## 2023-10-28 ENCOUNTER — Encounter: Payer: Self-pay | Admitting: Family Medicine

## 2023-10-28 ENCOUNTER — Ambulatory Visit: Admitting: Family Medicine

## 2023-10-28 VITALS — BP 125/83 | HR 68 | Ht 74.0 in | Wt 391.6 lb

## 2023-10-28 DIAGNOSIS — Z23 Encounter for immunization: Secondary | ICD-10-CM

## 2023-10-28 DIAGNOSIS — R319 Hematuria, unspecified: Secondary | ICD-10-CM | POA: Diagnosis not present

## 2023-10-28 LAB — POCT URINALYSIS DIP (MANUAL ENTRY)
Bilirubin, UA: NEGATIVE
Glucose, UA: NEGATIVE mg/dL
Ketones, POC UA: NEGATIVE mg/dL
Leukocytes, UA: NEGATIVE
Nitrite, UA: NEGATIVE
Protein Ur, POC: NEGATIVE mg/dL
Spec Grav, UA: 1.025 (ref 1.010–1.025)
Urobilinogen, UA: 0.2 U/dL
pH, UA: 6 (ref 5.0–8.0)

## 2023-10-28 LAB — POCT UA - MICROSCOPIC ONLY
Bacteria, U Microscopic: NONE SEEN
WBC, Ur, HPF, POC: NONE SEEN (ref 0–5)

## 2023-10-28 MED ORDER — TAMSULOSIN HCL 0.4 MG PO CAPS: 0.4000 mg | ORAL_CAPSULE | Freq: Every day | ORAL | 0 refills | Status: AC

## 2023-10-28 NOTE — Progress Notes (Signed)
    SUBJECTIVE:   CHIEF COMPLAINT / HPI:   Anthony Collier is a 48yo M that pf hematuria.  - Reports this morning, was at work and then peed. Noticed some drops of blood mixed in with yellow urine. - Has had more urinary pressure discomfort when peeing. Denies burning sensation. - Denies new sexual partners, does not feel at risk for any STI's.  - Denies polyuria - Denies fevers - Denies family hx of kidney stones.  - Denies new penis lesions - Denies back pain - No new meds, No blood thinners or asa.  - No preceding trauma - No visible blood in urine when he gave urine sample. Bleeding less in urine since this AM.    PERTINENT  PMH / PSH: T2DM, obesity, HTN  OBJECTIVE:   BP 125/83   Pulse 68   Ht 6' 2 (1.88 m)   Wt (!) 391 lb 9.6 oz (177.6 kg)   SpO2 97%   BMI 50.28 kg/m   General: Alert, pleasant man. NAD. HEENT: NCAT. MMM. CV: RRR, no murmurs.   Resp: CTAB, no wheezing or crackles. Normal WOB on RA.  Abm:  - Mild suprapubic and RLQ pressure to palpation. No rebound, guarding, rigidity. No CVA tenderness. Ext: Moves all ext spontaneously Skin: Warm, well perfused   ASSESSMENT/PLAN:   Assessment & Plan Hematuria, unspecified type Differential includes kidney stone, UTI, Gc/Ch. [name] is most likely given acute onset and symptoms starting to improve. Less likely UTI, Gc/Ch given lack of infectious signs and no known exposure. Urethral trauma also considered, although no known preceding trauma. UA postive for RBC, supporting diagnosis of stones.  - Start flomax 0.4mg  daily until pain improves - Encouraged hydration to prevent future stones - BMP - f/u in 1 week. Consider stopping flomax if symptoms improved. If persistent, consider renal US  and BMP.  - reutnr precautions discussed     Anthony Nearing, MD [facility]

## 2023-10-28 NOTE — Progress Notes (Deleted)
    SUBJECTIVE:   CHIEF COMPLAINT / HPI:   ***    DG is a 48yo M that pf bloody urine    Fevers, dysuria, medication -sexually active  PERTINENT  PMH / PSH: ***  OBJECTIVE:   There were no vitals taken for this visit.  ***  ASSESSMENT/PLAN:   Assessment & Plan      Twyla Nearing, MD [facility]

## 2023-10-28 NOTE — Patient Instructions (Addendum)
 1) The blood in your urine is most likely from a kidney stone. These can occur in older adults and is due to minerals building up in your urine. - Start taking flomax once a day. This will relax your urethra and help you pass the stone. - You may not see the stone when it passes. These stones can be very small. - Come back in 1 week. We can reassess you and see when you can stop taking the medicaitons.

## 2023-10-29 LAB — BASIC METABOLIC PANEL WITH GFR
BUN/Creatinine Ratio: 16 (ref 9–20)
BUN: 13 mg/dL (ref 6–24)
CO2: 21 mmol/L (ref 20–29)
Calcium: 9.1 mg/dL (ref 8.7–10.2)
Chloride: 102 mmol/L (ref 96–106)
Creatinine, Ser: 0.83 mg/dL (ref 0.76–1.27)
Glucose: 124 mg/dL — ABNORMAL HIGH (ref 70–99)
Potassium: 4.1 mmol/L (ref 3.5–5.2)
Sodium: 135 mmol/L (ref 134–144)
eGFR: 109 mL/min/1.73 (ref >59)

## 2023-11-01 ENCOUNTER — Ambulatory Visit: Payer: Self-pay | Admitting: Family Medicine

## 2023-11-02 ENCOUNTER — Telehealth: Payer: Self-pay

## 2023-11-02 ENCOUNTER — Ambulatory Visit: Admitting: Family Medicine

## 2023-11-02 ENCOUNTER — Encounter: Payer: Self-pay | Admitting: Family Medicine

## 2023-11-02 VITALS — BP 128/93 | HR 76 | Ht 74.0 in | Wt 391.6 lb

## 2023-11-02 DIAGNOSIS — R310 Gross hematuria: Secondary | ICD-10-CM

## 2023-11-02 DIAGNOSIS — L608 Other nail disorders: Secondary | ICD-10-CM

## 2023-11-02 DIAGNOSIS — R1024 Suprapubic pain: Secondary | ICD-10-CM | POA: Diagnosis not present

## 2023-11-02 NOTE — Progress Notes (Unsigned)
    SUBJECTIVE:   CHIEF COMPLAINT / HPI:   DG is a 48yo M that pf hematuria f/u.  - Since last visit, no more hematuria or pain with peeing - He has been taking flomax without issue - Denies family hx of stones   Thickened Toenails - Reports chronic hx of thickened nails on toes. - No pain or itching - Does not remember preceding trauma - Denies wearing tight shoes, he wears steel toed shoes for work.   PERTINENT  PMH / PSH: kidney stone, HTN, OSA, HLD  OBJECTIVE:   BP (!) 128/93   Pulse 76   Ht 6' 2 (1.88 m)   Wt (!) 391 lb 9.6 oz (177.6 kg)   SpO2 98%   BMI 50.28 kg/m   General: Alert, pleasant man. NAD. HEENT: NCAT. MMM. CV: RRR, no murmurs.  Resp: CTAB, no wheezing or crackles. Normal WOB on RA.  Abm: Soft, nontender, nondistended. BS present. No suprapubic pressure or CVA tenderness.  Ext: Moves all ext spontaneously Skin: Warm, well perfused  Nails: Thicked, yellow nails on R 1st and 5th toe; also on all nails on L foot.   ASSESSMENT/PLAN:   Assessment & Plan Gross hematuria Suspected 2/2 nephrolithiasis. Now improved and asymptomatic for a week on flomax. Can now stop flomax. Counseled on return precautions for future. Given gross hematuria and no known hx of stone, will get CT renal study and also urology referral for workup of hematuria.  Toenail deformity Differential includes fungal vs chronic trauma reactive changes. Will send fungal culture to workup.    Twyla Nearing, MD [facility]

## 2023-11-02 NOTE — Telephone Encounter (Signed)
 Called centralized scheduling and spoke with Norman to schedule a CT appointment for patient.  Was able to schedule for 11/05/23 at 8 am with the arrival time being at 7:45am at [facility].  Informed patient of appointment via MyChart message.  Harlene Reiter, CMA

## 2023-11-05 ENCOUNTER — Ambulatory Visit (HOSPITAL_COMMUNITY)
Admission: RE | Admit: 2023-11-05 | Discharge: 2023-11-05 | Disposition: A | Discharge status: Home / Self Care | Source: Ambulatory Visit | Attending: Family Medicine | Admitting: Family Medicine

## 2023-11-05 DIAGNOSIS — R1024 Suprapubic pain: Secondary | ICD-10-CM | POA: Diagnosis present

## 2023-11-05 DIAGNOSIS — R310 Gross hematuria: Secondary | ICD-10-CM | POA: Insufficient documentation

## 2023-11-08 ENCOUNTER — Ambulatory Visit: Payer: Self-pay | Admitting: Family Medicine

## 2023-11-20 ENCOUNTER — Other Ambulatory Visit: Payer: Self-pay | Admitting: Family Medicine

## 2023-11-20 DIAGNOSIS — R319 Hematuria, unspecified: Secondary | ICD-10-CM

## 2023-11-25 LAB — FUNGUS CULTURE, BLOOD

## 2023-11-29 ENCOUNTER — Encounter: Payer: Self-pay | Admitting: Radiology

## 2023-12-07 DIAGNOSIS — N200 Calculus of kidney: Secondary | ICD-10-CM | POA: Insufficient documentation

## 2023-12-07 NOTE — Progress Notes (Unsigned)
 SUBJECTIVE:   CHIEF COMPLAINT / HPI:  Anthony Collier is a 48 y.o. male with a pertinent past medical history of T2DM, obesity, HTN, and sciatica presenting to the clinic for follow up on chronic conditions below.  Type 2 diabetes mellitus - Prior A1c 7.0 on 8/5 - Home CBGs: Running 120s, not checking consistently - Medications: Trulicity  3 mg weekly - Adherence: Missed 3-4 weeks of Trulicity  - Eye exam: DUE - Foot exam: DUE - Microalbumin: UTD - Statin: Atorvastatin  10 mg daily - No symptoms of hypoglycemia, polyuria, polydipsia, numbness of extremities, foot ulcers/trauma  Sciatica Chronic right sided sciatica. 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 Has been unsuccessful getting insurance to cover his sciatica surgery, at this point patient has been working in his primary landscaping position again with improvement in symptoms. Currently reports improvement in symptoms, some pain at work. Is taking gabapentin  300 mg TID for pain with moderate improvement.  Hematuria Renal stone Patient has been having intermittent hematuria over the past few months. CT renal stone study on 10/10 showed 5 mm and 1-2 mm right nonobstructive renal stones. Patient was referred to [facility] on 10/7, referral has been processed. Patient reports that he is not having any groin or flank pain, no visible hematuria.  Onychomycosis Patient reportedly has had worsening toenail fungus for several months. Notes that toenail fungus has worsened since he has returned back to work wearing his steel toed boots and sweating frequently. This fungus is distressing to him and he would like it treated.  PERTINENT PMH / PSH: T2DM on Trulicity , OSA, HTN, sciatica  *Remainder reviewed in problem list.   OBJECTIVE:   BP (!) 135/50   Pulse 65   Ht 6' 2 (1.88 m)   Wt (!) 392 lb (177.8 kg)   SpO2 99%   BMI  50.33 kg/m   General: Age-appropriate, resting comfortably in chair, NAD, alert and at baseline. Cardiovascular: Regular rate and rhythm. Normal S1/S2. No murmurs, rubs, or gallops appreciated. 2+ radial pulses. Pulmonary: Clear bilaterally to ascultation. No wheezes, crackles, or rhonchi. Normal WOB on room air. No accessory muscle use. Abdominal: No tenderness to deep or light palpation. No rebound or guarding. No HSM. Skin: Warm and dry. Extremities: No peripheral edema bilaterally.  Bilateral onychomycosis of great toes.  Diabetic foot exam as below.  Capillary refill <2 seconds.  Diabetic Foot Exam - Simple   Simple Foot Form Diabetic Foot exam was performed with the following findings: Yes 12/08/2023 11:07 AM  Visual Inspection No deformities, no ulcerations, no other skin breakdown bilaterally: Yes See comments: Yes Sensation Testing Intact to touch and monofilament testing bilaterally: Yes Pulse Check Posterior Tibialis and Dorsalis pulse intact bilaterally: Yes Comments Diabetic foot exam with monofilament shows intact sensation and discrimination over all toes and plantar surfaces of feet bilaterally.  No ulcers or calluses present.  Onychomycosis bilaterally as above.    Results for orders placed or performed in visit on 12/08/23 (from the past 48 hours)  HgB A1c     Status: Abnormal   Collection Time: 12/08/23 10:27 AM  Result Value Ref Range   Hemoglobin A1C     HbA1c POC (<> result, manual entry)     HbA1c, POC (prediabetic range)     HbA1c, POC (controlled diabetic range) 7.5 (A) 0.0 - 7.0 %     ASSESSMENT/PLAN:   Assessment & Plan Type 2 diabetes  mellitus without complication, without long-term current use of insulin  (HCC) A1c today 7.5, increased from 7 prior, however this is in the setting of missing a few doses of Trulicity .  Patient would like to increase dose today, did discuss possibility of side effects given that he has missed a few doses.  After shared  decision-making, patient would like to proceed with increasing dose. - Increase Trulicity  to 4.5 mg weekly Arthritis of left knee Sciatica of right side Significantly improved from several months ago.  Patient is now able to work.  Patient is younger than 56, no history of GI bleed, no history of CAD. - Prescribed diclofenac  75 mg x 30 pills, instructed patient to use sparingly - Patient aware that long-term use of NSAIDs can cause GI irritation and bleed - If knee pain persist, return for Toradol  shot Renal stone Previously noted on CT scan.  Previously referred to urology, has not heard from them yet. - Referral has been processed, [facility] accepted - Provided [facility] phone number in AVS and instructed to call to schedule appointment Severe obstructive sleep apnea Patient noncompliant with CPAP.  Does have CPAP at home, titrated and mask is prepared. - Counseled on importance of adherence with CPAP, long-term cardiac risk Onychomycosis Onychomycosis of bilateral great toes.  Diagnosed visually today.  Distressing for the patient, he would like to proceed with treatment after shared decision making. - Oral terbinafine 250 mg daily x 90 days, 3 refills provided - Suspect duration of treatment will need to be extended past 3 months, counseled patient on this - CMP today to check LFTs - Return in 6 months for repeat CMP and LFT monitoring Colon cancer screening Never done previously. - Ambulatory referral to GI  Return in about 6 months (around 06/06/2024) for CMP, onychomycosis, T2DM f/u.  Aditi Rovira Toma, MD [facility]

## 2023-12-07 NOTE — Assessment & Plan Note (Signed)
 A1c today 7.5, increased from 7 prior, however this is in the setting of missing a few doses of Trulicity .  Patient would like to increase dose today, did discuss possibility of side effects given that he has missed a few doses.  After shared decision-making, patient would like to proceed with increasing dose. - Increase Trulicity  to 4.5 mg weekly

## 2023-12-08 ENCOUNTER — Encounter: Payer: Self-pay | Admitting: Family Medicine

## 2023-12-08 ENCOUNTER — Ambulatory Visit (INDEPENDENT_AMBULATORY_CARE_PROVIDER_SITE_OTHER): Admitting: Family Medicine

## 2023-12-08 VITALS — BP 135/50 | HR 65 | Ht 74.0 in | Wt 392.0 lb

## 2023-12-08 DIAGNOSIS — E119 Type 2 diabetes mellitus without complications: Secondary | ICD-10-CM | POA: Diagnosis not present

## 2023-12-08 DIAGNOSIS — N200 Calculus of kidney: Secondary | ICD-10-CM | POA: Diagnosis not present

## 2023-12-08 DIAGNOSIS — M5431 Sciatica, right side: Secondary | ICD-10-CM | POA: Diagnosis not present

## 2023-12-08 DIAGNOSIS — G4733 Obstructive sleep apnea (adult) (pediatric): Secondary | ICD-10-CM

## 2023-12-08 DIAGNOSIS — M1712 Unilateral primary osteoarthritis, left knee: Secondary | ICD-10-CM | POA: Insufficient documentation

## 2023-12-08 DIAGNOSIS — B351 Tinea unguium: Secondary | ICD-10-CM

## 2023-12-08 DIAGNOSIS — Z1211 Encounter for screening for malignant neoplasm of colon: Secondary | ICD-10-CM

## 2023-12-08 DIAGNOSIS — Z0000 Encounter for general adult medical examination without abnormal findings: Secondary | ICD-10-CM

## 2023-12-08 LAB — POCT GLYCOSYLATED HEMOGLOBIN (HGB A1C): HbA1c, POC (controlled diabetic range): 7.5 % — AB (ref 0.0–7.0)

## 2023-12-08 MED ORDER — TRULICITY 4.5 MG/0.5ML ~~LOC~~ SOAJ: 4.5000 mg | SUBCUTANEOUS | 3 refills | Status: AC

## 2023-12-08 MED ORDER — TERBINAFINE HCL 250 MG PO TABS: 250.0000 mg | ORAL_TABLET | Freq: Every day | ORAL | 3 refills | Status: AC

## 2023-12-08 MED ORDER — DICLOFENAC SODIUM 75 MG PO TBEC
75.0000 mg | DELAYED_RELEASE_TABLET | Freq: Two times a day (BID) | ORAL | 0 refills | Status: AC
Start: 2023-12-08 — End: ?

## 2023-12-08 NOTE — Assessment & Plan Note (Signed)
 Significantly improved from several months ago.  Patient is now able to work.  Patient is younger than 33, no history of GI bleed, no history of CAD. - Prescribed diclofenac  75 mg x 30 pills, instructed patient to use sparingly - Patient aware that long-term use of NSAIDs can cause GI irritation and bleed - If knee pain persist, return for Toradol  shot

## 2023-12-08 NOTE — Assessment & Plan Note (Signed)
 Patient noncompliant with CPAP.  Does have CPAP at home, titrated and mask is prepared. - Counseled on importance of adherence with CPAP, long-term cardiac risk

## 2023-12-08 NOTE — Assessment & Plan Note (Deleted)
 A1c today ***, ***. - Continue Trulicity  3 mg weekly

## 2023-12-08 NOTE — Patient Instructions (Addendum)
 It was great to see you today! Thank you for choosing [facility] for your primary care.  Today we addressed: 1.  Diabetes Your A1c is up to 7.5 today, but this is probably because you have missed some Trulicity  doses.  We will increase your dose to 4.5 mg today, though because you have missed a few doses may have a little more nausea and stomach pain from this.  It should improve the longer you use it.  If you run out of Trulicity  again please give us  a call at the clinic.  2. Arthritis of left knee I prescribed diclofenac  30 pills.  Please use these sparingly, as using them really frequently can cause a stomach or intestine bleed.  If you are continuing to have knee pain, please come back and we can give you a one-time Toradol  shot.  3. Kidney stone Please call the phone number below to schedule a follow up.  [facility] [address] [place] 2nd floor [phone]  4.  Toenail fungus I am prescribing oral terbinafine and checking a baseline liver function today.  You should take this once daily for the next 3 months.  I suspect that this will not be a long enough course.  This treatment can take a while unfortunately.  You have 3 refills if needed.  Please continue taking this once daily until your toenails grow out without fungus.  You should return in 6 months to check your liver function again.  5. Colonoscopy Schedule your colonoscopy to help detect colon cancer. The stomach doctors will call to schedule an appt with you. If you decide against this, please ask about the cologuard as an option.  6. Sleep apnea Please wear your CPAP nightly.  This is very important, as long-term can cause heart problems if you do not wear it.  We are checking some labs today, including liver function.  You will get a MyChart message or a letter if results are normal. Otherwise, you will get a call from us .  You should return to our clinic in 6 months to check your liver function on the  terbinafine.  Thank you for coming to see us  at [facility] and for the opportunity to care for you! Toma, Aryan Bello, MD 12/08/2023, 10:33 AM

## 2023-12-08 NOTE — Assessment & Plan Note (Signed)
 Previously noted on CT scan.  Previously referred to urology, has not heard from them yet. - Referral has been processed, [facility] accepted - Provided [facility] phone number in AVS and instructed to call to schedule appointment

## 2023-12-09 ENCOUNTER — Ambulatory Visit: Payer: Self-pay | Admitting: Family Medicine

## 2023-12-09 LAB — COMPREHENSIVE METABOLIC PANEL WITH GFR
ALT: 17 IU/L (ref 0–44)
AST: 18 IU/L (ref 0–40)
Albumin: 4.2 g/dL (ref 4.1–5.1)
Alkaline Phosphatase: 80 IU/L (ref 47–123)
BUN/Creatinine Ratio: 13 (ref 9–20)
BUN: 11 mg/dL (ref 6–24)
Bilirubin Total: 0.2 mg/dL (ref 0.0–1.2)
CO2: 21 mmol/L (ref 20–29)
Calcium: 9.3 mg/dL (ref 8.7–10.2)
Chloride: 102 mmol/L (ref 96–106)
Creatinine, Ser: 0.88 mg/dL (ref 0.76–1.27)
Globulin, Total: 3.3 g/dL (ref 1.5–4.5)
Glucose: 114 mg/dL — ABNORMAL HIGH (ref 70–99)
Potassium: 4.4 mmol/L (ref 3.5–5.2)
Sodium: 133 mmol/L — ABNORMAL LOW (ref 134–144)
Total Protein: 7.5 g/dL (ref 6.0–8.5)
eGFR: 107 mL/min/1.73 (ref >59)
